# Patient Record
Sex: Female | Born: 1980 | Race: White | Hispanic: No | Marital: Married | Smoking: Former smoker
Health system: Southern US, Community
[De-identification: ages and names within clinical notes are randomized; demographics above are authoritative.]

## PROBLEM LIST (undated history)

## (undated) DIAGNOSIS — IMO0001 Reserved for inherently not codable concepts without codable children: Secondary | ICD-10-CM

## (undated) DIAGNOSIS — R200 Anesthesia of skin: Secondary | ICD-10-CM

## (undated) DIAGNOSIS — R51 Headache: Secondary | ICD-10-CM

## (undated) DIAGNOSIS — O24419 Gestational diabetes mellitus in pregnancy, unspecified control: Secondary | ICD-10-CM

## (undated) DIAGNOSIS — B977 Papillomavirus as the cause of diseases classified elsewhere: Secondary | ICD-10-CM

## (undated) DIAGNOSIS — E282 Polycystic ovarian syndrome: Secondary | ICD-10-CM

## (undated) DIAGNOSIS — R87619 Unspecified abnormal cytological findings in specimens from cervix uteri: Secondary | ICD-10-CM

## (undated) DIAGNOSIS — T8859XA Other complications of anesthesia, initial encounter: Secondary | ICD-10-CM

## (undated) DIAGNOSIS — K5909 Other constipation: Secondary | ICD-10-CM

## (undated) DIAGNOSIS — R202 Paresthesia of skin: Secondary | ICD-10-CM

## (undated) DIAGNOSIS — K219 Gastro-esophageal reflux disease without esophagitis: Secondary | ICD-10-CM

## (undated) DIAGNOSIS — K602 Anal fissure, unspecified: Secondary | ICD-10-CM

## (undated) DIAGNOSIS — IMO0002 Reserved for concepts with insufficient information to code with codable children: Secondary | ICD-10-CM

## (undated) HISTORY — PX: TOOTH EXTRACTION: SUR596

## (undated) HISTORY — DX: Paresthesia of skin: R20.2

## (undated) HISTORY — DX: Anesthesia of skin: R20.0

## (undated) HISTORY — PX: NECK SURGERY: SHX720

## (undated) HISTORY — PX: CERVICAL DISC SURGERY: SHX588

## (undated) HISTORY — DX: Anal fissure, unspecified: K60.2

---

## 2001-06-22 ENCOUNTER — Other Ambulatory Visit: Admission: RE | Admit: 2001-06-22 | Discharge: 2001-06-22 | Payer: Self-pay | Admitting: Obstetrics and Gynecology

## 2004-08-09 ENCOUNTER — Emergency Department (HOSPITAL_COMMUNITY): Admission: EM | Admit: 2004-08-09 | Discharge: 2004-08-09 | Payer: Self-pay | Admitting: Emergency Medicine

## 2011-01-25 LAB — ABO/RH: RH Type: POSITIVE

## 2011-01-25 LAB — RUBELLA ANTIBODY, IGM: Rubella: IMMUNE

## 2011-01-25 LAB — RPR: RPR: NONREACTIVE

## 2011-06-24 ENCOUNTER — Encounter: Payer: BC Managed Care – PPO | Attending: Obstetrics and Gynecology | Admitting: *Deleted

## 2011-06-24 ENCOUNTER — Encounter: Payer: Self-pay | Admitting: *Deleted

## 2011-06-24 DIAGNOSIS — Z713 Dietary counseling and surveillance: Secondary | ICD-10-CM | POA: Insufficient documentation

## 2011-06-24 DIAGNOSIS — E119 Type 2 diabetes mellitus without complications: Secondary | ICD-10-CM | POA: Insufficient documentation

## 2011-06-24 NOTE — Progress Notes (Signed)
  Patient was seen on 06/24/2011 for Gestational Diabetes self-management visit at the [facility]. The following learning objectives were met by the patient during this course:   States the definition of Gestational Diabetes  States why dietary management is important in controlling blood glucose  Describes the effects each nutrient has on blood glucose levels  Demonstrates ability to create a balanced meal plan  Demonstrates carbohydrate counting   States when to check blood glucose levels  Demonstrates proper blood glucose monitoring techniques  States the effect of stress and exercise on blood glucose levels  States the importance of limiting caffeine and abstaining from alcohol and smoking  Blood glucose monitor given: Architectural technologist Self Monitoring Kit Lot # [id number] Exp: 09/04/2012 Blood glucose reading: 74  Patient instructed to monitor glucose levels: FBS: 60 - <90 2 hour: <120  *Patient received handouts:  Nutrition Diabetes and Pregnancy  Carbohydrate Counting List  Patient will be seen for follow-up as needed.

## 2011-06-24 NOTE — Patient Instructions (Signed)
Goals:  Check glucose levels per MD as instructed  Follow Gestational Diabetes Diet as instructed  Call for follow-up as needed    

## 2011-06-25 ENCOUNTER — Ambulatory Visit: Payer: Self-pay | Admitting: *Deleted

## 2011-06-26 ENCOUNTER — Ambulatory Visit: Payer: Self-pay

## 2011-08-06 HISTORY — PX: CHOLECYSTECTOMY: SHX55

## 2011-08-14 ENCOUNTER — Encounter (HOSPITAL_COMMUNITY): Payer: Self-pay

## 2011-08-14 ENCOUNTER — Inpatient Hospital Stay (HOSPITAL_COMMUNITY)
Admission: AD | Admit: 2011-08-14 | Discharge: 2011-08-14 | Disposition: A | Discharge status: Home / Self Care | Payer: BC Managed Care – PPO | Source: Ambulatory Visit | Attending: Obstetrics and Gynecology | Admitting: Obstetrics and Gynecology

## 2011-08-14 HISTORY — DX: Polycystic ovarian syndrome: E28.2

## 2011-08-14 HISTORY — DX: Unspecified abnormal cytological findings in specimens from cervix uteri: R87.619

## 2011-08-14 HISTORY — DX: Gestational diabetes mellitus in pregnancy, unspecified control: O24.419

## 2011-08-14 HISTORY — DX: Papillomavirus as the cause of diseases classified elsewhere: B97.7

## 2011-08-14 HISTORY — DX: Reserved for concepts with insufficient information to code with codable children: IMO0002

## 2011-08-14 HISTORY — DX: Reserved for inherently not codable concepts without codable children: IMO0001

## 2011-08-14 HISTORY — DX: Headache: R51

## 2011-08-14 HISTORY — DX: Other constipation: K59.09

## 2011-08-14 NOTE — ED Notes (Signed)
?   SROM @ 0830 this AM; G1; 36 & 6/7 weeks gest;

## 2011-08-14 NOTE — ED Provider Notes (Signed)
History     Chief Complaint  Patient presents with  . Rupture of Membranes   HPIKelly M Downs is 31 y.o. G1P0 [redacted]w[redacted]d weeks presenting with ? Rupture of membranes. Pt of [facility].   Gives hx of loss of mucous plug yesterday.  Had thinner discharge today.  Denies vaginal bleeding.      Past Medical History  Diagnosis Date  . Abnormal Pap smear   . HPV in female   . Dysplasia   . Tuberculosis   . Gestational diabetes   . Headache   . Polycystic ovarian syndrome   . Constipation, chronic     Past Surgical History  Procedure Date  . Tooth extraction   . Neck surgery     No family history on file.  History  Substance Use Topics  . Smoking status: Former Games developer  . Smokeless tobacco: Not on file  . Alcohol Use: No    Allergies:  Allergies  Allergen Reactions  . Doxycycline Hives  . Penicillins Hives and Swelling  . Sulfa Antibiotics Hives    Prescriptions prior to admission  Medication Sig Dispense Refill  . Prenatal Vit-Fe Fumarate-FA (PRENATAL MULTIVITAMIN) TABS Take 1 tablet by mouth at bedtime.        ROS Physical Exam   Blood pressure 130/81, pulse 90, temperature 98.5 F (36.9 C), temperature source Oral, resp. rate 18, height 5\' 8"  (1.727 m), weight 255 lb 9.6 oz (115.939 kg), SpO2 99.00%.  Physical Exam   CERVICAL EXAM by D. Montez Morita, RN closed. Pooling of vaginal fluid not seen in the canal.  FERN was negative.  MAU Course  Procedures  MDM Findings reported to Dr. Arelia Sneddon by D. Montez Morita, RN  Assessment and Plan  A:  [redacted]weeks pregnant with intact membranes  P:  Keep scheduled appt.  Call your doctor for contractions, vaginal bleeding or loss of fluid or decreased fetal movement.  KEY,EVE M 08/14/2011, 2:50 PM   Matt Holmes, NP 08/14/11 1501

## 2011-08-14 NOTE — Progress Notes (Signed)
Patient states she started leaking clear fluid at 0830, has continued to have episodes of having wet panties. Has been having some lower abdominal tightening. Reports no bleeding and fetal movement.

## 2011-08-15 ENCOUNTER — Encounter (HOSPITAL_COMMUNITY): Payer: Self-pay

## 2011-08-18 ENCOUNTER — Encounter (HOSPITAL_COMMUNITY): Payer: Self-pay

## 2011-08-23 ENCOUNTER — Encounter (HOSPITAL_COMMUNITY): Payer: Self-pay

## 2011-08-27 ENCOUNTER — Encounter (HOSPITAL_COMMUNITY): Payer: Self-pay

## 2011-08-27 ENCOUNTER — Encounter (HOSPITAL_COMMUNITY)
Admission: RE | Admit: 2011-08-27 | Discharge: 2011-08-27 | Disposition: A | Discharge status: Home / Self Care | Payer: BC Managed Care – PPO | Source: Ambulatory Visit | Attending: Obstetrics and Gynecology | Admitting: Obstetrics and Gynecology

## 2011-08-27 HISTORY — DX: Gastro-esophageal reflux disease without esophagitis: K21.9

## 2011-08-27 LAB — CBC
MCHC: 34.4 g/dL (ref 30.0–36.0)
MCV: 92.3 fL (ref 78.0–100.0)
Platelets: 241 10*3/uL (ref 150–400)
RDW: 13.3 % (ref 11.5–15.5)
WBC: 8.2 10*3/uL (ref 4.0–10.5)

## 2011-08-27 LAB — BASIC METABOLIC PANEL
BUN: 10 mg/dL (ref 6–23)
CO2: 20 mEq/L (ref 19–32)
Calcium: 9.4 mg/dL (ref 8.4–10.5)
Chloride: 104 mEq/L (ref 96–112)
Creatinine, Ser: 0.69 mg/dL (ref 0.50–1.10)

## 2011-08-27 LAB — SURGICAL PCR SCREEN
MRSA, PCR: NEGATIVE
Staphylococcus aureus: NEGATIVE

## 2011-08-27 NOTE — Patient Instructions (Addendum)
   Your procedure is scheduled on: Monday January 28th  Enter through the Main Entrance of [facility] at: 11:30am Pick up the phone at the desk and dial [phone] and inform us of your arrival.  Please call this number if you have any problems the morning of surgery: [phone]  Remember: Do not eat food after midnight:Sunday Do not drink clear liquids after: 9am Monday Take these medicines the morning of surgery with a SIP OF WATER:zantac  Do not wear jewelry, make-up, or FINGER nail polish Do not wear lotions, powders, perfumes or deodorant. Do not shave 48 hours prior to surgery. Do not bring valuables to the hospital.  Leave suitcase in the car. After Surgery it may be brought to your room. For patients being admitted to the hospital, checkout time is 11:00am the day of discharge.     Remember to use your hibiclens as instructed.Please shower with 1/2 bottle the evening before your surgery and the other 1/2 bottle the morning of surgery.

## 2011-09-02 ENCOUNTER — Encounter (HOSPITAL_COMMUNITY): Payer: Self-pay

## 2011-09-02 ENCOUNTER — Encounter (HOSPITAL_COMMUNITY): Payer: Self-pay | Admitting: Anesthesiology

## 2011-09-02 ENCOUNTER — Encounter (HOSPITAL_COMMUNITY): Payer: Self-pay | Admitting: *Deleted

## 2011-09-02 ENCOUNTER — Encounter (HOSPITAL_COMMUNITY)
Admission: RE | Disposition: A | Discharge status: Home / Self Care | Payer: Self-pay | Source: Ambulatory Visit | Attending: Obstetrics and Gynecology

## 2011-09-02 ENCOUNTER — Inpatient Hospital Stay (HOSPITAL_COMMUNITY): Payer: BC Managed Care – PPO

## 2011-09-02 ENCOUNTER — Inpatient Hospital Stay (HOSPITAL_COMMUNITY)
Admission: RE | Admit: 2011-09-02 | Discharge: 2011-09-04 | DRG: 651 | Disposition: A | Discharge status: Home / Self Care | Payer: BC Managed Care – PPO | Source: Ambulatory Visit | Attending: Obstetrics and Gynecology | Admitting: Obstetrics and Gynecology

## 2011-09-02 DIAGNOSIS — O99814 Abnormal glucose complicating childbirth: Principal | ICD-10-CM | POA: Diagnosis present

## 2011-09-02 LAB — GLUCOSE, CAPILLARY: Glucose-Capillary: 74 mg/dL (ref 70–99)

## 2011-09-02 SURGERY — Surgical Case
Anesthesia: Spinal | Wound class: Clean Contaminated

## 2011-09-02 MED ORDER — OXYTOCIN 20 UNITS IN LACTATED RINGERS INFUSION - SIMPLE
125.0000 mL/h | INTRAVENOUS | Status: DC
  Administered 2011-09-02: 125 mL/h via INTRAVENOUS

## 2011-09-02 MED ORDER — MEASLES, MUMPS & RUBELLA VAC ~~LOC~~ INJ
0.5000 mL | INJECTION | Freq: Once | SUBCUTANEOUS | Status: DC
  Filled 2011-09-02: qty 0.5

## 2011-09-02 MED ORDER — NALBUPHINE HCL 10 MG/ML IJ SOLN: 5.0000 mg | INTRAMUSCULAR | Status: DC | PRN

## 2011-09-02 MED ORDER — MENTHOL 3 MG MT LOZG: 1.0000 | LOZENGE | OROMUCOSAL | Status: DC | PRN

## 2011-09-02 MED ORDER — OXYCODONE-ACETAMINOPHEN 5-325 MG PO TABS
1.0000 | ORAL_TABLET | ORAL | Status: DC | PRN
  Administered 2011-09-04 (×2): 1 via ORAL
  Filled 2011-09-02 (×2): qty 1

## 2011-09-02 MED ORDER — SIMETHICONE 80 MG PO CHEW
80.0000 mg | CHEWABLE_TABLET | Freq: Three times a day (TID) | ORAL | Status: DC
  Administered 2011-09-02 – 2011-09-04 (×7): 80 mg via ORAL

## 2011-09-02 MED ORDER — MEDROXYPROGESTERONE ACETATE 150 MG/ML IM SUSP: 150.0000 mg | INTRAMUSCULAR | Status: DC | PRN

## 2011-09-02 MED ORDER — PRENATAL MULTIVITAMIN CH
1.0000 | ORAL_TABLET | Freq: Every day | ORAL | Status: DC
  Administered 2011-09-03 – 2011-09-04 (×2): 1 via ORAL
  Filled 2011-09-02 (×2): qty 1

## 2011-09-02 MED ORDER — SODIUM CHLORIDE 0.9 % IV SOLN: 1.0000 ug/kg/h | INTRAVENOUS | Status: DC | PRN

## 2011-09-02 MED ORDER — OXYTOCIN 10 UNIT/ML IJ SOLN
INTRAMUSCULAR | Status: DC | PRN
  Administered 2011-09-02: 20 [IU] via INTRAMUSCULAR

## 2011-09-02 MED ORDER — FENTANYL CITRATE 0.05 MG/ML IJ SOLN
INTRAMUSCULAR | Status: AC
  Filled 2011-09-02: qty 2

## 2011-09-02 MED ORDER — NALBUPHINE HCL 10 MG/ML IJ SOLN
5.0000 mg | INTRAMUSCULAR | Status: DC | PRN
  Administered 2011-09-02: 10 mg via SUBCUTANEOUS

## 2011-09-02 MED ORDER — IBUPROFEN 600 MG PO TABS
600.0000 mg | ORAL_TABLET | Freq: Four times a day (QID) | ORAL | Status: DC | PRN
  Filled 2011-09-02 (×6): qty 1

## 2011-09-02 MED ORDER — ONDANSETRON HCL 4 MG/2ML IJ SOLN
INTRAMUSCULAR | Status: DC | PRN
  Administered 2011-09-02: 4 mg via INTRAVENOUS

## 2011-09-02 MED ORDER — DIPHENHYDRAMINE HCL 25 MG PO CAPS: 25.0000 mg | ORAL_CAPSULE | ORAL | Status: DC | PRN

## 2011-09-02 MED ORDER — DIPHENHYDRAMINE HCL 50 MG/ML IJ SOLN: 25.0000 mg | INTRAMUSCULAR | Status: DC | PRN

## 2011-09-02 MED ORDER — CLINDAMYCIN PHOSPHATE 900 MG/50ML IV SOLN
900.0000 mg | Freq: Once | INTRAVENOUS | Status: AC
  Administered 2011-09-02: 900 mg via INTRAVENOUS
  Filled 2011-09-02: qty 50

## 2011-09-02 MED ORDER — MORPHINE SULFATE 0.5 MG/ML IJ SOLN
INTRAMUSCULAR | Status: AC
  Filled 2011-09-02: qty 10

## 2011-09-02 MED ORDER — DEXTROSE IN LACTATED RINGERS 5 % IV SOLN
INTRAVENOUS | Status: DC
  Administered 2011-09-03: via INTRAVENOUS

## 2011-09-02 MED ORDER — EPHEDRINE 5 MG/ML INJ
INTRAVENOUS | Status: AC
  Filled 2011-09-02: qty 10

## 2011-09-02 MED ORDER — DIPHENHYDRAMINE HCL 50 MG/ML IJ SOLN: 12.5000 mg | INTRAMUSCULAR | Status: DC | PRN

## 2011-09-02 MED ORDER — BUPIVACAINE IN DEXTROSE 0.75-8.25 % IT SOLN
INTRATHECAL | Status: DC | PRN
  Administered 2011-09-02: 1.5 mL via INTRATHECAL

## 2011-09-02 MED ORDER — SCOPOLAMINE 1 MG/3DAYS TD PT72
MEDICATED_PATCH | TRANSDERMAL | Status: AC
  Administered 2011-09-02: 1.5 mg via TRANSDERMAL
  Filled 2011-09-02: qty 1

## 2011-09-02 MED ORDER — KETOROLAC TROMETHAMINE 60 MG/2ML IM SOLN
INTRAMUSCULAR | Status: AC
  Administered 2011-09-02: 60 mg via INTRAMUSCULAR
  Filled 2011-09-02: qty 2

## 2011-09-02 MED ORDER — DIPHENHYDRAMINE HCL 25 MG PO CAPS: 25.0000 mg | ORAL_CAPSULE | Freq: Four times a day (QID) | ORAL | Status: DC | PRN

## 2011-09-02 MED ORDER — NALBUPHINE SYRINGE 5 MG/0.5 ML
INJECTION | INTRAMUSCULAR | Status: AC
  Administered 2011-09-02: 10 mg via SUBCUTANEOUS
  Filled 2011-09-02: qty 0.5

## 2011-09-02 MED ORDER — OXYTOCIN 20 UNITS IN LACTATED RINGERS INFUSION - SIMPLE: 125.0000 mL/h | INTRAVENOUS | Status: AC

## 2011-09-02 MED ORDER — WITCH HAZEL-GLYCERIN EX PADS: 1.0000 "application " | MEDICATED_PAD | CUTANEOUS | Status: DC | PRN

## 2011-09-02 MED ORDER — LANOLIN HYDROUS EX OINT: 1.0000 "application " | TOPICAL_OINTMENT | CUTANEOUS | Status: DC | PRN

## 2011-09-02 MED ORDER — SCOPOLAMINE 1 MG/3DAYS TD PT72: 1.0000 | MEDICATED_PATCH | Freq: Once | TRANSDERMAL | Status: DC

## 2011-09-02 MED ORDER — PHENYLEPHRINE 40 MCG/ML (10ML) SYRINGE FOR IV PUSH (FOR BLOOD PRESSURE SUPPORT)
PREFILLED_SYRINGE | INTRAVENOUS | Status: AC
  Filled 2011-09-02: qty 5

## 2011-09-02 MED ORDER — ONDANSETRON HCL 4 MG/2ML IJ SOLN: 4.0000 mg | Freq: Three times a day (TID) | INTRAMUSCULAR | Status: DC | PRN

## 2011-09-02 MED ORDER — ONDANSETRON HCL 4 MG PO TABS: 4.0000 mg | ORAL_TABLET | ORAL | Status: DC | PRN

## 2011-09-02 MED ORDER — EPHEDRINE SULFATE 50 MG/ML IJ SOLN
INTRAMUSCULAR | Status: DC | PRN
  Administered 2011-09-02: 10 mg via INTRAVENOUS

## 2011-09-02 MED ORDER — ONDANSETRON HCL 4 MG/2ML IJ SOLN
INTRAMUSCULAR | Status: AC
  Filled 2011-09-02: qty 2

## 2011-09-02 MED ORDER — SENNOSIDES-DOCUSATE SODIUM 8.6-50 MG PO TABS
2.0000 | ORAL_TABLET | Freq: Every day | ORAL | Status: DC
  Administered 2011-09-02 – 2011-09-03 (×2): 2 via ORAL

## 2011-09-02 MED ORDER — NALBUPHINE SYRINGE 5 MG/0.5 ML
INJECTION | INTRAMUSCULAR | Status: AC
  Filled 2011-09-02: qty 0.5

## 2011-09-02 MED ORDER — ONDANSETRON HCL 4 MG/2ML IJ SOLN: 4.0000 mg | INTRAMUSCULAR | Status: DC | PRN

## 2011-09-02 MED ORDER — METOCLOPRAMIDE HCL 5 MG/ML IJ SOLN: 10.0000 mg | Freq: Three times a day (TID) | INTRAMUSCULAR | Status: DC | PRN

## 2011-09-02 MED ORDER — PHENYLEPHRINE HCL 10 MG/ML IJ SOLN
INTRAMUSCULAR | Status: DC | PRN
  Administered 2011-09-02: .4 mg via INTRAVENOUS

## 2011-09-02 MED ORDER — OXYTOCIN 10 UNIT/ML IJ SOLN
INTRAMUSCULAR | Status: AC
  Filled 2011-09-02: qty 2

## 2011-09-02 MED ORDER — FENTANYL CITRATE 0.05 MG/ML IJ SOLN: 25.0000 ug | INTRAMUSCULAR | Status: DC | PRN

## 2011-09-02 MED ORDER — LACTATED RINGERS IV SOLN
INTRAVENOUS | Status: DC
  Administered 2011-09-02 (×2): via INTRAVENOUS

## 2011-09-02 MED ORDER — OXYTOCIN 20 UNITS IN LACTATED RINGERS INFUSION - SIMPLE
INTRAVENOUS | Status: AC
  Filled 2011-09-02: qty 1000

## 2011-09-02 MED ORDER — DIBUCAINE 1 % RE OINT: 1.0000 "application " | TOPICAL_OINTMENT | RECTAL | Status: DC | PRN

## 2011-09-02 MED ORDER — SIMETHICONE 80 MG PO CHEW
80.0000 mg | CHEWABLE_TABLET | ORAL | Status: DC | PRN
Start: 2011-09-02 — End: 2011-09-04

## 2011-09-02 MED ORDER — FENTANYL CITRATE 0.05 MG/ML IJ SOLN
INTRAMUSCULAR | Status: DC | PRN
  Administered 2011-09-02: 25 ug via INTRAVENOUS
  Administered 2011-09-02: 15 ug via INTRATHECAL
  Administered 2011-09-02: 25 ug via INTRAVENOUS

## 2011-09-02 MED ORDER — DEXAMETHASONE SODIUM PHOSPHATE 10 MG/ML IJ SOLN: INTRAMUSCULAR | Status: DC | PRN

## 2011-09-02 MED ORDER — TETANUS-DIPHTH-ACELL PERTUSSIS 5-2.5-18.5 LF-MCG/0.5 IM SUSP: 0.5000 mL | Freq: Once | INTRAMUSCULAR | Status: DC

## 2011-09-02 MED ORDER — 0.9 % SODIUM CHLORIDE (POUR BTL) OPTIME
TOPICAL | Status: DC | PRN
  Administered 2011-09-02: 1000 mL

## 2011-09-02 MED ORDER — MEPERIDINE HCL 25 MG/ML IJ SOLN: 6.2500 mg | INTRAMUSCULAR | Status: DC | PRN

## 2011-09-02 MED ORDER — SCOPOLAMINE 1 MG/3DAYS TD PT72
1.0000 | MEDICATED_PATCH | Freq: Once | TRANSDERMAL | Status: DC
  Administered 2011-09-02: 1.5 mg via TRANSDERMAL

## 2011-09-02 MED ORDER — KETOROLAC TROMETHAMINE 60 MG/2ML IM SOLN
60.0000 mg | Freq: Once | INTRAMUSCULAR | Status: AC | PRN
  Administered 2011-09-02: 60 mg via INTRAMUSCULAR

## 2011-09-02 MED ORDER — KETOROLAC TROMETHAMINE 30 MG/ML IJ SOLN: 30.0000 mg | Freq: Four times a day (QID) | INTRAMUSCULAR | Status: AC | PRN

## 2011-09-02 MED ORDER — SODIUM CHLORIDE 0.9 % IJ SOLN: 3.0000 mL | INTRAMUSCULAR | Status: DC | PRN

## 2011-09-02 MED ORDER — MORPHINE SULFATE (PF) 0.5 MG/ML IJ SOLN
INTRAMUSCULAR | Status: DC | PRN
  Administered 2011-09-02: 1 mg via INTRAVENOUS
  Administered 2011-09-02: .1 mg via INTRATHECAL
  Administered 2011-09-02: 1 mg via INTRAVENOUS

## 2011-09-02 MED ORDER — NALOXONE HCL 0.4 MG/ML IJ SOLN: 0.4000 mg | INTRAMUSCULAR | Status: DC | PRN

## 2011-09-02 MED ORDER — IBUPROFEN 600 MG PO TABS
600.0000 mg | ORAL_TABLET | Freq: Four times a day (QID) | ORAL | Status: DC
  Administered 2011-09-02 – 2011-09-04 (×7): 600 mg via ORAL
  Filled 2011-09-02: qty 1

## 2011-09-02 MED ORDER — PHENYLEPHRINE HCL 10 MG/ML IJ SOLN
INTRAMUSCULAR | Status: DC | PRN
  Administered 2011-09-02: 80 ug via INTRAVENOUS
  Administered 2011-09-02: 40 ug via INTRAVENOUS

## 2011-09-02 SURGICAL SUPPLY — 27 items
CHLORAPREP W/TINT 26ML (MISCELLANEOUS) ×2 IMPLANT
CLOTH BEACON ORANGE TIMEOUT ST (SAFETY) ×2 IMPLANT
DRSG COVADERM 4X8 (GAUZE/BANDAGES/DRESSINGS) ×2 IMPLANT
ELECT REM PT RETURN 9FT ADLT (ELECTROSURGICAL) ×2
ELECTRODE REM PT RTRN 9FT ADLT (ELECTROSURGICAL) ×1 IMPLANT
EXTRACTOR VACUUM M CUP 4 TUBE (SUCTIONS) IMPLANT
GLOVE BIO SURGEON STRL SZ 6.5 (GLOVE) ×4 IMPLANT
GLOVE BIOGEL PI IND STRL 7.0 (GLOVE) ×2 IMPLANT
GLOVE BIOGEL PI INDICATOR 7.0 (GLOVE) ×2
GLOVE ECLIPSE 6.5 STRL STRAW (GLOVE) ×2 IMPLANT
GOWN PREVENTION PLUS LG XLONG (DISPOSABLE) ×6 IMPLANT
GOWN SURGICAL LARGE (GOWNS) ×2 IMPLANT
KIT ABG SYR 3ML LUER SLIP (SYRINGE) ×2 IMPLANT
NEEDLE HYPO 25X5/8 SAFETYGLIDE (NEEDLE) ×2 IMPLANT
NS IRRIG 1000ML POUR BTL (IV SOLUTION) ×2 IMPLANT
PACK C SECTION WH (CUSTOM PROCEDURE TRAY) ×2 IMPLANT
SLEEVE SCD COMPRESS KNEE MED (MISCELLANEOUS) IMPLANT
STAPLER VISISTAT 35W (STAPLE) IMPLANT
SUT CHROMIC 0 CT 802H (SUTURE) IMPLANT
SUT CHROMIC 0 CTX 36 (SUTURE) ×6 IMPLANT
SUT MNCRL AB 3-0 PS2 27 (SUTURE) IMPLANT
SUT MON AB-0 CT1 36 (SUTURE) ×2 IMPLANT
SUT PDS AB 0 CTX 60 (SUTURE) ×2 IMPLANT
SUT PLAIN 0 NONE (SUTURE) IMPLANT
TOWEL OR 17X24 6PK STRL BLUE (TOWEL DISPOSABLE) ×4 IMPLANT
TRAY FOLEY CATH 14FR (SET/KITS/TRAYS/PACK) IMPLANT
WATER STERILE IRR 1000ML POUR (IV SOLUTION) ×2 IMPLANT

## 2011-09-02 NOTE — Anesthesia Preprocedure Evaluation (Addendum)
Anesthesia Evaluation  Patient identified by MRN, date of birth, ID band Patient awake    Reviewed: Allergy & Precautions, H&P , NPO status , Patient's Chart, lab work & pertinent test results, reviewed documented beta blocker date and time   History of Anesthesia Complications Negative for: history of anesthetic complications  Airway Mallampati: I TM Distance: >3 FB Neck ROM: full    Dental  (+) Teeth Intact   Pulmonary neg pulmonary ROS,  clear to auscultation        Cardiovascular neg cardio ROS regular Normal    Neuro/Psych Negative Neurological ROS  Negative Psych ROS   GI/Hepatic Neg liver ROS, GERD- (OTC meds prn.  zantac today)  Medicated,  Endo/Other  Diabetes mellitus-, Well Controlled, GestationalObesity PCOS  Renal/GU negative Renal ROS     Musculoskeletal   Abdominal   Peds  Hematology negative hematology ROS (+)   Anesthesia Other Findings   Reproductive/Obstetrics (+) Pregnancy                           Anesthesia Physical Anesthesia Plan  ASA: II  Anesthesia Plan: Spinal   Post-op Pain Management:    Induction:   Airway Management Planned:   Additional Equipment:   Intra-op Plan:   Post-operative Plan:   Informed Consent: I have reviewed the patients History and Physical, chart, labs and discussed the procedure including the risks, benefits and alternatives for the proposed anesthesia with the patient or authorized representative who has indicated his/her understanding and acceptance.   Dental Advisory Given  Plan Discussed with: CRNA and Surgeon  Anesthesia Plan Comments:         Anesthesia Quick Evaluation

## 2011-09-02 NOTE — Anesthesia Postprocedure Evaluation (Signed)
Anesthesia Post Note  Patient: Dana Downs  Procedure(s) Performed:  CESAREAN SECTION - Primary cesarean section  Anesthesia type: Spinal  Patient location: PACU  Post pain: Pain level controlled  Post assessment: Post-op Vital signs reviewed  Last Vitals:  Filed Vitals:   09/02/11 1445  BP: 120/62  Pulse: 72  Temp: 36.4 C  Resp: 20    Post vital signs: Reviewed  Level of consciousness: awake  Complications: No apparent anesthesia complications

## 2011-09-02 NOTE — Anesthesia Procedure Notes (Signed)
Spinal  Patient location during procedure: OR Start time: 09/02/2011 1:04 PM Staffing Performed by: anesthesiologist  Preanesthetic Checklist Completed: patient identified, site marked, surgical consent, pre-op evaluation, timeout performed, IV checked, risks and benefits discussed and monitors and equipment checked Spinal Block Patient position: sitting Prep: site prepped and draped and DuraPrep Patient monitoring: blood pressure, continuous pulse ox and heart rate Approach: midline Location: L3-4 Injection technique: single-shot Needle Needle type: Sprotte  Needle gauge: 24 G Needle length: 9 cm Assessment Sensory level: T4 Additional Notes Clear free flow CSF on first attempt.  Patient tolerated procedure well.  Jasmine December, MD

## 2011-09-02 NOTE — H&P (Signed)
31 yo G1P0 @ 39+4 wks presents for elective c-section.  No ctx, vb or lof.  Pregnancy complicated by well controlled A1DM.  Past History - See hollister, GBS neg  AF, VSS +FHT Gen - NAD ABd - gravid, NT CV - RRR Lungs - clear bilaterally PV- cvx 4cm  A/P:  Pt declines TOL.  R/B/A d/w pt and informed consent obtained.

## 2011-09-02 NOTE — Transfer of Care (Signed)
Immediate Anesthesia Transfer of Care Note  Patient: Dana Downs  Procedure(s) Performed:  CESAREAN SECTION - Primary cesarean section  Patient Location: PACU  Anesthesia Type: Spinal  Level of Consciousness: awake, alert  and oriented  Airway & Oxygen Therapy: Patient Spontanous Breathing  Post-op Assessment: Report given to PACU RN and Post -op Vital signs reviewed and stable  Post vital signs: Reviewed and stable  Complications: No apparent anesthesia complications2

## 2011-09-03 LAB — CBC
HCT: 32.9 % — ABNORMAL LOW (ref 36.0–46.0)
Platelets: 200 10*3/uL (ref 150–400)
RBC: 3.53 MIL/uL — ABNORMAL LOW (ref 3.87–5.11)
RDW: 13.4 % (ref 11.5–15.5)
WBC: 8.3 10*3/uL (ref 4.0–10.5)

## 2011-09-03 NOTE — Addendum Note (Signed)
Addendum  created 09/03/11 0756 by Truitt Leep, CRNA   Modules edited:Notes Section

## 2011-09-03 NOTE — Anesthesia Postprocedure Evaluation (Signed)
  Anesthesia Post-op Note  Patient: Dana Downs  Procedure(s) Performed:  CESAREAN SECTION - Primary cesarean section  Patient Location: PACU and Mother/Baby  Anesthesia Type: Spinal  Level of Consciousness: awake, alert  and oriented  Airway and Oxygen Therapy: Patient Spontanous Breathing  Post-op Pain: mild  Post-op Assessment: Patient's Cardiovascular Status Stable, Respiratory Function Stable and No signs of Nausea or vomiting, pain level controlled.  Post-op Vital Signs: stable  Complications: No apparent anesthesia complications

## 2011-09-03 NOTE — Progress Notes (Signed)
Subjective: Postpartum Day 1: Cesarean Delivery Patient reports tolerating PO and no problems voiding.    Objective: Vital signs in last 24 hours: Temp:  [97.5 F (36.4 C)-99.2 F (37.3 C)] 98.9 F (37.2 C) (01/29 0515) Pulse Rate:  [54-110] 54  (01/29 0515) Resp:  [16-20] 20  (01/29 0515) BP: (95-126)/(52-80) 100/60 mmHg (01/29 0515) SpO2:  [96 %-100 %] 96 % (01/29 0515) Weight:  [116.121 kg (256 lb)] 116.121 kg (256 lb) (01/28 1700)  Physical Exam:  General: alert and cooperative Lochia: appropriate Uterine Fundus: firm Abd dressing CDI BS+ DVT Evaluation: No evidence of DVT seen on physical exam.   Basename 09/03/11 0545  HGB 11.1*  HCT 32.9*    Assessment/Plan: Status post Cesarean section. Doing well postoperatively.  Continue current care.  Rhandi Despain G 09/03/2011, 7:57 AM

## 2011-09-03 NOTE — Op Note (Signed)
Cesarean Section Procedure Note   Dana Downs  09/02/2011  Indications: maternal request   Pre-operative Diagnosis: Gestational diabetes, maternal request.   Post-operative Diagnosis: Same   Surgeon: Surgeon(s) and Role:    * Zelphia Cairo, MD - Primary   Assistants: none  Anesthesia: spinal   Procedure Details:  The patient was seen in the Holding Room. The risks, benefits, complications, treatment options, and expected outcomes were discussed with the patient. The patient concurred with the proposed plan, giving informed consent. identified as FALISA LAMORA and the procedure verified as C-Section Delivery. A Time Out was held and the above information confirmed.  After induction of anesthesia, the patient was draped and prepped in the usual sterile manner. A transverse was made and carried down through the subcutaneous tissue to the fascia. Fascial incision was made and extended transversely. The fascia was separated from the underlying rectus tissue superiorly and inferiorly. The peritoneum was identified and entered. Peritoneal incision was extended longitudinally. The utero-vesical peritoneal reflection was incised transversely and the bladder flap was bluntly freed from the lower uterine segment. A low transverse uterine incision was made. Delivered from cephalic presentation was a viable infant. Cord ph was sent the umbilical cord was clamped and cut cord blood was obtained for evaluation. The placenta was removed Intact and appeared normal. The uterine outline, tubes and ovaries appeared normal}. The uterine incision was closed with running locked sutures of 0chromic gut.   Hemostasis was observed. Lavage was carried out until clear. Peritoneum was reaproximated with 0 monocryl.  The fascia was then reapproximated with running sutures of 0PDS. The skin was closed with staples.   Instrument, sponge, and needle counts were correct prior the abdominal closure and were correct at the  conclusion of the case.     Estimated Blood Loss: 600cc  Urine Output: clear  Specimens: none  Complications: no complications  Disposition: PACU - hemodynamically stable.   Maternal Condition: stable   Baby condition / location:  nursery-stable  Attending Attestation: I was present and scrubbed for the entire procedure.   Signed: Surgeon(s): Zelphia Cairo, MD

## 2011-09-04 ENCOUNTER — Encounter (HOSPITAL_COMMUNITY): Payer: Self-pay | Admitting: Obstetrics and Gynecology

## 2011-09-04 MED ORDER — IBUPROFEN 600 MG PO TABS
600.0000 mg | ORAL_TABLET | Freq: Four times a day (QID) | ORAL | Status: AC | PRN
Start: 2011-09-04 — End: 2011-09-14

## 2011-09-04 MED ORDER — OXYCODONE-ACETAMINOPHEN 5-325 MG PO TABS: 1.0000 | ORAL_TABLET | ORAL | Status: AC | PRN

## 2011-09-04 NOTE — Progress Notes (Signed)
Subjective: Postpartum Day 2: Cesarean Delivery Patient reports tolerating PO, + flatus and no problems voiding.    Objective: Vital signs in last 24 hours: Temp:  [97.8 F (36.6 C)-98.2 F (36.8 C)] 97.8 F (36.6 C) (01/30 0541) Pulse Rate:  [62-83] 66  (01/30 0541) Resp:  [18-20] 18  (01/30 0541) BP: (108-113)/(54-74) 113/67 mmHg (01/30 0541) SpO2:  [95 %-98 %] 95 % (01/30 0541)  Physical Exam:  General: alert and cooperative Lochia: appropriate Uterine Fundus: firm Incision: healing well DVT Evaluation: No evidence of DVT seen on physical exam.   Basename 09/03/11 0545  HGB 11.1*  HCT 32.9*    Assessment/Plan: Status post Cesarean section. Doing well postoperatively.  Discharge home with standard precautions and return to clinic in 1-2 days for staple removal.  Anani Gu G 09/04/2011, 7:56 AM

## 2011-09-04 NOTE — Discharge Summary (Signed)
Obstetric Discharge Summary Reason for Admission: cesarean section Prenatal Procedures: ultrasound Intrapartum Procedures: cesarean: low cervical, transverse Postpartum Procedures: none Complications-Operative and Postpartum: none Hemoglobin  Date Value Range Status  09/03/2011 11.1* 12.0-15.0 (g/dL) Final     HCT  Date Value Range Status  09/03/2011 32.9* 36.0-46.0 (%) Final    Discharge Diagnoses: Term Pregnancy-delivered  Discharge Information: Date: 09/04/2011 Activity: pelvic rest Diet: routine Medications: PNV, Ibuprofen and Percocet Condition: stable Instructions: refer to practice specific booklet Discharge to: home   Newborn Data: Live born female  Birth Weight: 7 lb 7.6 oz (3390 g) APGAR: 9, 9  Home with mother.  Dana Downs G 09/04/2011, 8:09 AM

## 2011-09-05 ENCOUNTER — Inpatient Hospital Stay (HOSPITAL_COMMUNITY): Admission: AD | Admit: 2011-09-05 | Payer: Self-pay | Source: Ambulatory Visit | Admitting: Obstetrics and Gynecology

## 2012-11-20 ENCOUNTER — Encounter: Payer: Self-pay | Admitting: *Deleted

## 2012-11-24 ENCOUNTER — Ambulatory Visit (INDEPENDENT_AMBULATORY_CARE_PROVIDER_SITE_OTHER): Payer: BC Managed Care – PPO | Admitting: Family Medicine

## 2012-11-24 ENCOUNTER — Encounter: Payer: Self-pay | Admitting: Family Medicine

## 2012-11-24 VITALS — BP 152/90 | HR 70 | Wt 248.0 lb

## 2012-11-24 DIAGNOSIS — I10 Essential (primary) hypertension: Secondary | ICD-10-CM | POA: Insufficient documentation

## 2012-11-24 MED ORDER — HYDROCHLOROTHIAZIDE 25 MG PO TABS: 25.0000 mg | ORAL_TABLET | Freq: Every day | ORAL | Status: DC

## 2012-11-24 MED ORDER — POTASSIUM CHLORIDE ER 10 MEQ PO TBCR: 10.0000 meq | EXTENDED_RELEASE_TABLET | Freq: Two times a day (BID) | ORAL | Status: DC

## 2012-11-24 NOTE — Progress Notes (Signed)
  Subjective:    Patient ID: Dana Downs, female    DOB: 1980-09-25, 32 y.o.   MRN: [id number]  Hypertension This is a new problem. The current episode started more than 1 month ago. The problem is unchanged. The problem is uncontrolled. Pertinent negatives include no chest pain, headaches, malaise/fatigue, neck pain, orthopnea or palpitations. There are no associated agents to hypertension. There are no known risk factors for coronary artery disease. Past treatments include lifestyle changes. The current treatment provides no improvement. There are no compliance problems.  There is no history of angina, kidney disease or CVA. There is no history of chronic renal disease or sleep apnea.      Review of Systems  Constitutional: Negative for fever, malaise/fatigue, activity change and fatigue.  HENT: Negative for congestion, rhinorrhea and neck pain.   Respiratory: Negative for choking and chest tightness.   Cardiovascular: Negative for chest pain, palpitations and orthopnea.  Neurological: Negative for headaches.       Objective:   Physical Exam  Vitals reviewed. Constitutional: She appears well-developed and well-nourished.  HENT:  Head: Normocephalic.  Neck: Normal range of motion. Neck supple. No thyromegaly present.  Cardiovascular: Normal rate, regular rhythm and normal heart sounds.   Pulmonary/Chest: Effort normal and breath sounds normal.  Abdominal: Soft.  Lymphadenopathy:    She has no cervical adenopathy.  Skin: She is not diaphoretic.          Assessment & Plan:  Hypertension-lifestyle changes alone did not do enough. She needs to do a better job in minimizing starches in her diet stay away from sweet tea. Exercise on a regular basis. Try to reduce weight. Also start HCTZ 25 mg one half in the morning do this over the course of the next few weeks. If needing blood pressure goals stick with this if not increase to a full 25 mg every morning. Potassium 10 mEq was also  prescribed one or 2 per day depending on the dose of the HCTZ. She will check her blood pressure through her nurse practitioner at work and then send the results to [place] we will see her back in approximately 4 months.

## 2012-11-24 NOTE — Patient Instructions (Addendum)
Fax # [phone]  Send labs to [place]  Start with a half tablet daily   Goal bp 130/80 or less  Follow up 4 months

## 2013-01-04 ENCOUNTER — Telehealth: Payer: Self-pay | Admitting: Family Medicine

## 2013-01-04 NOTE — Telephone Encounter (Signed)
Advised patient to hold meds and follow up in office this week per Dr Lorin Picket. Patient verbalized understanding.

## 2013-01-04 NOTE — Telephone Encounter (Signed)
Patient is getting headaches and nausea from the blood pressure medication that she was prescribed back in April. She just began it today because the gastro doctor advised her that the cream that she was using would lower her BP. She would like to know if this is normal?

## 2013-01-06 ENCOUNTER — Ambulatory Visit: Payer: BC Managed Care – PPO | Admitting: Family Medicine

## 2013-01-08 ENCOUNTER — Ambulatory Visit (INDEPENDENT_AMBULATORY_CARE_PROVIDER_SITE_OTHER): Payer: BC Managed Care – PPO | Admitting: Family Medicine

## 2013-01-08 ENCOUNTER — Encounter: Payer: Self-pay | Admitting: Family Medicine

## 2013-01-08 VITALS — BP 137/82 | HR 80 | Wt 260.0 lb

## 2013-01-08 DIAGNOSIS — R5381 Other malaise: Secondary | ICD-10-CM

## 2013-01-08 DIAGNOSIS — I10 Essential (primary) hypertension: Secondary | ICD-10-CM

## 2013-01-08 DIAGNOSIS — R635 Abnormal weight gain: Secondary | ICD-10-CM

## 2013-01-08 NOTE — Patient Instructions (Addendum)
DASH Diet The DASH diet stands for "Dietary Approaches to Stop Hypertension." It is a healthy eating plan that has been shown to reduce high blood pressure (hypertension) in as little as 14 days, while also possibly providing other significant health benefits. These other health benefits include reducing the risk of breast cancer after menopause and reducing the risk of type 2 diabetes, heart disease, colon cancer, and stroke. Health benefits also include weight loss and slowing kidney failure in patients with chronic kidney disease.  DIET GUIDELINES  Limit salt (sodium). Your diet should contain less than 1500 mg of sodium daily.  Limit refined or processed carbohydrates. Your diet should include mostly whole grains. Desserts and added sugars should be used sparingly.  Include small amounts of heart-healthy fats. These types of fats include nuts, oils, and tub margarine. Limit saturated and trans fats. These fats have been shown to be harmful in the body. CHOOSING FOODS  The following food groups are based on a 2000 calorie diet. See your Registered Dietitian for individual calorie needs. Grains and Grain Products (6 to 8 servings daily)  Eat More Often: Whole-wheat bread, brown rice, whole-grain or wheat pasta, quinoa, popcorn without added fat or salt (air popped).  Eat Less Often: White bread, white pasta, white rice, cornbread. Vegetables (4 to 5 servings daily)  Eat More Often: Fresh, frozen, and canned vegetables. Vegetables may be raw, steamed, roasted, or grilled with a minimal amount of fat.  Eat Less Often/Avoid: Creamed or fried vegetables. Vegetables in a cheese sauce. Fruit (4 to 5 servings daily)  Eat More Often: All fresh, canned (in natural juice), or frozen fruits. Dried fruits without added sugar. One hundred percent fruit juice ( cup [237 mL] daily).  Eat Less Often: Dried fruits with added sugar. Canned fruit in light or heavy syrup. [employer], Fish, and Poultry (2  servings or less daily. One serving is 3 to 4 oz [85-114 g]).  Eat More Often: Ninety percent or leaner ground beef, tenderloin, sirloin. Round cuts of beef, chicken breast, [place] breast. All fish. Grill, bake, or broil your meat. Nothing should be fried.  Eat Less Often/Avoid: Fatty cuts of meat, [place], or chicken leg, thigh, or wing. Fried cuts of meat or fish. Dairy (2 to 3 servings)  Eat More Often: Low-fat or fat-free milk, low-fat plain or light yogurt, reduced-fat or part-skim cheese.  Eat Less Often/Avoid: Milk (whole, 2%).Whole milk yogurt. Full-fat cheeses. Nuts, Seeds, and Legumes (4 to 5 servings per week)  Eat More Often: All without added salt.  Eat Less Often/Avoid: Salted nuts and seeds, canned beans with added salt. Fats and Sweets (limited)  Eat More Often: Vegetable oils, tub margarines without trans fats, sugar-free gelatin. Mayonnaise and salad dressings.  Eat Less Often/Avoid: Coconut oils, palm oils, butter, stick margarine, cream, half and half, cookies, candy, pie. FOR MORE INFORMATION The Dash Diet Eating Plan: www.dashdiet.org Document Released: 07/11/2011 Document Revised: 10/14/2011 Document Reviewed: 07/11/2011 [facility] Patient Information 2014 [place], [place].   Throat Step 1- bed on 6 inch block Step 2- continue omeprazole Step 3- if no better in 3 weeks then add store brand Allegra 180 mg Step 4- if still no better after 3 weeks  Then call , we will set up ENT  Monitor BP monthly if problems follow up   Book - " Eat to Live" by Dr. Landis Martins

## 2013-01-08 NOTE — Assessment & Plan Note (Signed)
Dietary measures and exercise to control it.

## 2013-01-08 NOTE — Progress Notes (Signed)
  Subjective:    Patient ID: Dana Downs, female    DOB: 23-Jan-1981, 32 y.o.   MRN: [id number]  HPIPatient comes in today followup. Overall she feels she is doing fairly good but she took the hydrochlorothiazide it caused headaches nausea and made her feel bad. She states that she denied any numbness or tingling. This seems to be doing better now that she has stop the medicine. 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 PMH hypertension family history hypertension ROS see above    Review of Systems     Objective:   Physical Exam  Lungs are clear hearts regular pulse normal extremities no edema blood pressure taken with a large cuff  On recheck was actually very good at 120/78      Assessment & Plan:  HTN-currently I don't find evidence of ongoing hypertension. I don't feel she needs to be on medication I told her that's best for her to try to maintain a healthy diet and exercise. Get her blood pressure checked periodically through her workplace followup here within 6 months for recheck. Dietary measures discussed. No medications indicated. Morning throat irritation-she describes burning in her throat in the morning when she wakes up I told her the best thing to start off with his putting her bed on 6 inch blocks continue omeprazole most likely due to reflux if that doesn't help then try 2-3 weeks of Allegra if that doesn't help notify us and we will set her up for ENT.

## 2014-05-14 ENCOUNTER — Encounter (HOSPITAL_COMMUNITY): Payer: Self-pay | Admitting: Emergency Medicine

## 2014-05-14 ENCOUNTER — Emergency Department (HOSPITAL_COMMUNITY)
Admission: EM | Admit: 2014-05-14 | Discharge: 2014-05-15 | Disposition: A | Discharge status: Home / Self Care | Payer: BC Managed Care – PPO | Attending: Emergency Medicine | Admitting: Emergency Medicine

## 2014-05-14 DIAGNOSIS — R51 Headache: Secondary | ICD-10-CM | POA: Insufficient documentation

## 2014-05-14 DIAGNOSIS — R519 Headache, unspecified: Secondary | ICD-10-CM

## 2014-05-14 DIAGNOSIS — Z87891 Personal history of nicotine dependence: Secondary | ICD-10-CM | POA: Diagnosis not present

## 2014-05-14 DIAGNOSIS — Q899 Congenital malformation, unspecified: Secondary | ICD-10-CM | POA: Insufficient documentation

## 2014-05-14 DIAGNOSIS — M791 Myalgia: Secondary | ICD-10-CM | POA: Insufficient documentation

## 2014-05-14 DIAGNOSIS — R509 Fever, unspecified: Secondary | ICD-10-CM

## 2014-05-14 DIAGNOSIS — R531 Weakness: Secondary | ICD-10-CM | POA: Insufficient documentation

## 2014-05-14 DIAGNOSIS — B349 Viral infection, unspecified: Secondary | ICD-10-CM | POA: Diagnosis not present

## 2014-05-14 DIAGNOSIS — Z79899 Other long term (current) drug therapy: Secondary | ICD-10-CM | POA: Diagnosis not present

## 2014-05-14 DIAGNOSIS — Z880 Allergy status to penicillin: Secondary | ICD-10-CM | POA: Diagnosis not present

## 2014-05-14 DIAGNOSIS — K5900 Constipation, unspecified: Secondary | ICD-10-CM | POA: Insufficient documentation

## 2014-05-14 DIAGNOSIS — Z8632 Personal history of gestational diabetes: Secondary | ICD-10-CM | POA: Diagnosis not present

## 2014-05-14 MED ORDER — IBUPROFEN 400 MG PO TABS
600.0000 mg | ORAL_TABLET | Freq: Once | ORAL | Status: AC
  Administered 2014-05-14: 600 mg via ORAL
  Filled 2014-05-14: qty 2

## 2014-05-14 NOTE — ED Notes (Addendum)
Pt c/o fever, generalized  bodyaches, dizziness, nasal congestion,headache, chills, and weakness. Pt was placed on Cefprozil for a "sinus infection yesterday. Her temp at home was 104.0

## 2014-05-14 NOTE — ED Provider Notes (Signed)
CSN: [id number]     Arrival date & time 05/14/14  2129 History  This chart was scribed for Dana Biles, MD by Jeanell Sparrow, ED Scribe. This patient was seen in room APA18/APA18 and the patient's care was started at 11:59 PM.   Chief Complaint  Patient presents with  . Fever   The history is provided by the patient. No language interpreter was used.   HPI Comments: Dana Downs is a 33 y.o. female with a hx of sinus infection who presents to the Emergency Department complaining of a constant moderate generalized headache that started yesterday. She describes the headache as a throbbing sensation. She reports that her pain currently is a 5/10 in severity and 10 at its worst. She states that she took ibuprofen without any relief.  She reports some tingling in her bilateral upper extremities. She states that her mother died of a brain aneurysm.   Pt reports that she had fever that started yesterday. She states that her temperature at home was 103.9. Pt's temperature in the ED is 102.9. She states that she saw her pcp and was diagnosed with a left ear infection and sinus headaches, started on antibiotics. She reports some associated generalized weakness and body aches with very mild nasal congestion. She denies any postnasal drip, neck pain, neck stiffness, nausea, emesis, ear ache, sore throat, chest pain, rash, or dysuria. No recent infection of the face either. No ear ringing or vertigo or loss of hearing.  Past Medical History  Diagnosis Date  . Abnormal Pap smear   . HPV in female   . Dysplasia   . Gestational diabetes   . Headache(784.0)   . Polycystic ovarian syndrome   . Constipation, chronic   . GERD (gastroesophageal reflux disease)     heartburn with pregnancy-occasional   Past Surgical History  Procedure Laterality Date  . Tooth extraction    . Neck surgery  age 66-gland removal    atypical microbacterial infection  . Cholecystectomy  2013  . Cesarean section  09/02/2011     Procedure: CESAREAN SECTION;  Surgeon: Marylynn Pearson, MD;  Location: [facility];  Service: Gynecology;  Laterality: N/A;  Primary cesarean section   Family History  Problem Relation Age of Onset  . Hypertension Mother   . Hyperlipidemia Father   . Cancer Paternal Grandmother     breast  . Diabetes Paternal Grandmother    History  Substance Use Topics  . Smoking status: Former Smoker    Quit date: 09/18/2009  . Smokeless tobacco: Not on file  . Alcohol Use: No   OB History   Grav Para Term Preterm Abortions TAB SAB Ect Mult Living   1 1 1       1      Review of Systems  Constitutional: Positive for fever and chills.  HENT: Positive for congestion. Negative for ear pain, postnasal drip and sore throat.   Cardiovascular: Negative for chest pain.  Gastrointestinal: Negative for nausea and vomiting.  Genitourinary: Negative for dysuria.  Musculoskeletal: Positive for myalgias. Negative for neck pain and neck stiffness.  Neurological: Positive for weakness and headaches.  All other systems reviewed and are negative.   Allergies  Penicillins; Doxycycline; Penicillins; Sulfa antibiotics; and Sulphur  Home Medications   Prior to Admission medications   Medication Sig Start Date End Date Taking? Authorizing Provider  cefPROZIL (CEFZIL) 500 MG tablet Take 500 mg by mouth 2 (two) times daily. 10 day course starting on 05/13/2014 05/13/14  Yes  Historical Provider, MD  ibuprofen (ADVIL,MOTRIN) 200 MG tablet Take 600 mg by mouth 2 (two) times daily as needed for mild pain or moderate pain.   Yes Historical Provider, MD  Linaclotide (LINZESS) 145 MCG CAPS Take 145 mcg by mouth daily.   Yes Historical Provider, MD  pseudoephedrine-acetaminophen (TYLENOL SINUS) 30-500 MG TABS Take 1 tablet by mouth 2 (two) times daily as needed (for sinus).   Yes Historical Provider, MD  ibuprofen (ADVIL,MOTRIN) 600 MG tablet Take 1 tablet (600 mg total) by mouth every 6 (six) hours as needed. 05/15/14    Dana Biles, MD  traMADol (ULTRAM) 50 MG tablet Take 1 tablet (50 mg total) by mouth every 6 (six) hours as needed. 05/15/14   [name] [name], MD   BP 139/90  Pulse 94  Temp(Src) 102.9 F (39.4 C) (Oral)  Resp 20  Ht 5\' 9"  (1.753 m)  Wt 245 lb (111.131 kg)  BMI 36.16 kg/m2  SpO2 96% Physical Exam  Nursing note and vitals reviewed. Constitutional: She is oriented to person, place, and time. She appears well-developed and well-nourished. No distress.  HENT:  Head: Normocephalic and atraumatic.  Mouth/Throat: Oropharynx is clear and moist. No oropharyngeal exudate.  Left TM appears to be dull. Rt is normal  Eyes: EOM are normal. Pupils are equal, round, and reactive to light.  Pupils 5 mm reactive to light. No blurry vision.  No nystagmus  Neck: Neck supple. No tracheal deviation present.  No nuchal rigidity noted. No preauricular or postauricular adenopathy.   Cardiovascular: Normal rate, regular rhythm and normal heart sounds.  Exam reveals no gallop and no friction rub.   No murmur heard. Pulmonary/Chest: Effort normal and breath sounds normal. No respiratory distress. She has no wheezes. She has no rales.  Abdominal: She exhibits no distension. There is no tenderness.  Musculoskeletal: Normal range of motion.  Lymphadenopathy:    She has no cervical adenopathy.  Neurological: She is alert and oriented to person, place, and time. No cranial nerve deficit. Coordination normal.  Cranial nerves 2 12 intact. Upper and lower sensations and strength normal.  Finger to nose cerebellar exam normal.   Skin: Skin is warm and dry.  Psychiatric: She has a normal mood and affect. Her behavior is normal.    ED Course  Procedures (including critical care time) DIAGNOSTIC STUDIES: Oxygen Saturation is 96% on RA, normal by my interpretation.    COORDINATION OF CARE: 12:03 AM- Pt advised of plan for treatment which includes medication and pt agrees.  Labs Review Labs Reviewed  BASIC  METABOLIC PANEL - Abnormal; Notable for the following:    Potassium 2.6 (*)    Glucose, Bld 172 (*)    Anion gap 17 (*)    All other components within normal limits  CBC WITH DIFFERENTIAL  LACTIC ACID, PLASMA    Imaging Review Ct Head Wo Contrast  05/15/2014   CLINICAL DATA:  Acute onset of constant moderate generalized headache, starting yesterday, with throbbing sensation and tingling in the arms. Fever. Generalized weakness, body aches and nasal congestion. Initial encounter.  EXAM: CT HEAD WITHOUT CONTRAST  TECHNIQUE: Contiguous axial images were obtained from the base of the skull through the vertex without intravenous contrast.  COMPARISON:  None.  FINDINGS: There is no evidence of acute infarction, mass lesion, or intra- or extra-axial hemorrhage on CT.  The posterior fossa, including the cerebellum, brainstem and fourth ventricle, is within normal limits. The third and lateral ventricles, and basal ganglia are unremarkable in  appearance. The cerebral hemispheres are symmetric in appearance, with normal gray-white differentiation. No mass effect or midline shift is seen.  There is no evidence of fracture; visualized osseous structures are unremarkable in appearance. The visualized portions of the orbits are within normal limits. The paranasal sinuses and mastoid air cells are well-aerated. No significant soft tissue abnormalities are seen.  IMPRESSION: Unremarkable noncontrast CT of the head.   Electronically Signed   By: Garald Balding M.D.   On: 05/15/2014 01:36     EKG Interpretation   Date/Time:  Sunday May 15 2014 01:36:07 EDT Ventricular Rate:  79 PR Interval:  144 QRS Duration: 100 QT Interval:  384 QTC Calculation: 440 R Axis:   42 Text Interpretation:  Sinus rhythm Baseline wander in lead(s) V4 V5  Confirmed by [name], MD, Lupie Sawa ([id number]) on 05/15/2014 1:43:23 AM        2:47 AM Pt's labs are reassuring.  CT head is normal. Fever responded, pain improved  dramatically with Toradol and Reglan. Pt and husband agree that she is a lot better. Warning signs discussed verbally, and will be written in the discharge paperwork. Potasium replaced.   MDM   Final diagnoses:  Viral syndrome  Acute intractable headache, unspecified headache type  Fever, unspecified fever cause   I personally performed the services described in this documentation, which was scribed in my presence. The recorded information has been reviewed and is accurate.  DDX includes: Primary headaches - including migrainous headaches, cluster headaches, tension headaches. ICH Carotid dissection Cavernous sinus thrombosis Meningitis Encephalitis Sinusitis Tumor Vascular headaches AV malformation Brain aneurysm Muscular headaches Psuedotumor cerebri  A/P: Pt comes in with cc of headaches. Pt has a fever as well.  She was started on antibiotics for sinusitis, and OM  - based on exam and description of the headache. However, she has no ear complains, and there is mild congestion only - so those etiologies are not high on the ddx clinically.  CT head was obtained, as pt states that her headache is worse when leaning forward or laying supine, and there is fam hx of AN (grand mother). We also wanted to make sure there was no signs of infection/inflammation/edema of the brain.  Labs were ordered as well, and are WNL.  Plan was to get LP if labs or CT were concerning, and/or if pt was getting worse. None of these however are present, and pt feels a lot better.  Pt is reliable, and there with her husband. Return precautions discussed. I suspect possible viral syndrome, even meningitis. Other possibility is psuedotumor cerebri. Informed her that i work tomorrow night, and to come back to the ER without hesitation if sx are getting worse.  Hypokalemia - potassium replaced.  Dana Biles, MD 05/15/14 [phone]

## 2014-05-15 ENCOUNTER — Emergency Department (HOSPITAL_COMMUNITY): Payer: BC Managed Care – PPO

## 2014-05-15 LAB — BASIC METABOLIC PANEL
ANION GAP: 17 — AB (ref 5–15)
BUN: 8 mg/dL (ref 6–23)
CHLORIDE: 98 meq/L (ref 96–112)
CO2: 22 meq/L (ref 19–32)
Calcium: 9 mg/dL (ref 8.4–10.5)
Creatinine, Ser: 0.82 mg/dL (ref 0.50–1.10)
GFR calc non Af Amer: >90 mL/min (ref >90)
Glucose, Bld: 172 mg/dL — ABNORMAL HIGH (ref 70–99)
POTASSIUM: 2.6 meq/L — AB (ref 3.7–5.3)
Sodium: 137 mEq/L (ref 137–147)

## 2014-05-15 LAB — CBC WITH DIFFERENTIAL/PLATELET
BASOS ABS: 0 10*3/uL (ref 0.0–0.1)
BASOS PCT: 0 % (ref 0–1)
Eosinophils Absolute: 0 10*3/uL (ref 0.0–0.7)
Eosinophils Relative: 0 % (ref 0–5)
HEMATOCRIT: 38.1 % (ref 36.0–46.0)
HEMOGLOBIN: 13.6 g/dL (ref 12.0–15.0)
LYMPHS PCT: 21 % (ref 12–46)
Lymphs Abs: 1.5 10*3/uL (ref 0.7–4.0)
MCH: 32.2 pg (ref 26.0–34.0)
MCHC: 35.7 g/dL (ref 30.0–36.0)
MCV: 90.3 fL (ref 78.0–100.0)
MONO ABS: 0.3 10*3/uL (ref 0.1–1.0)
Monocytes Relative: 4 % (ref 3–12)
NEUTROS ABS: 5.2 10*3/uL (ref 1.7–7.7)
NEUTROS PCT: 75 % (ref 43–77)
Platelets: 238 10*3/uL (ref 150–400)
RBC: 4.22 MIL/uL (ref 3.87–5.11)
RDW: 12.6 % (ref 11.5–15.5)
WBC: 7 10*3/uL (ref 4.0–10.5)

## 2014-05-15 LAB — LACTIC ACID, PLASMA: LACTIC ACID, VENOUS: 1.5 mmol/L (ref 0.5–2.2)

## 2014-05-15 IMAGING — CT CT HEAD W/O CM
1 series · 15 of 30 positions shown, 19 images · non-contrast
Comparison: None.

CLINICAL DATA: Acute onset of constant moderate generalized
headache, starting yesterday, with throbbing sensation and tingling
in the arms. Fever. Generalized weakness, body aches and nasal
congestion. Initial encounter.

EXAM:
CT HEAD WITHOUT CONTRAST
TECHNIQUE: Contiguous axial images were obtained from the base of the skull
through the vertex without intravenous contrast.

[Series 2: headseq 4.8 h37s · axial · 0.43mm/px · z∈[+1260,+1425]mm · 15 of 36 slices shown, 19 images]
[im 2/36  brain]
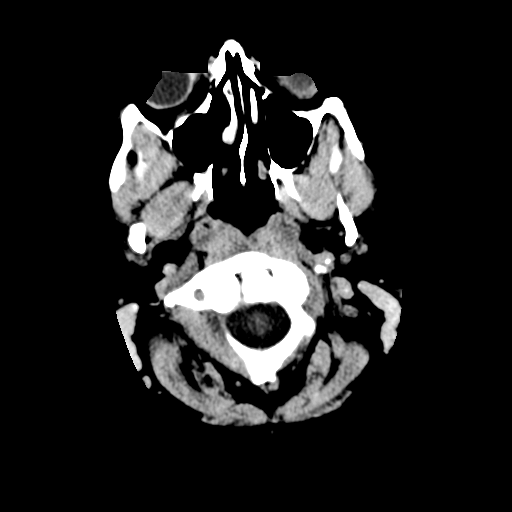
[im 2/36  bone]
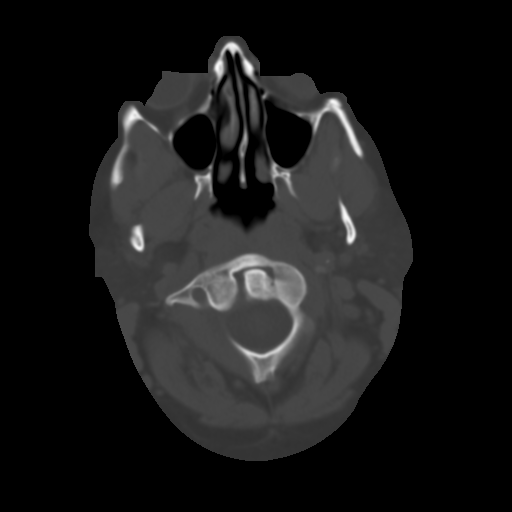
[im 4/36  brain]
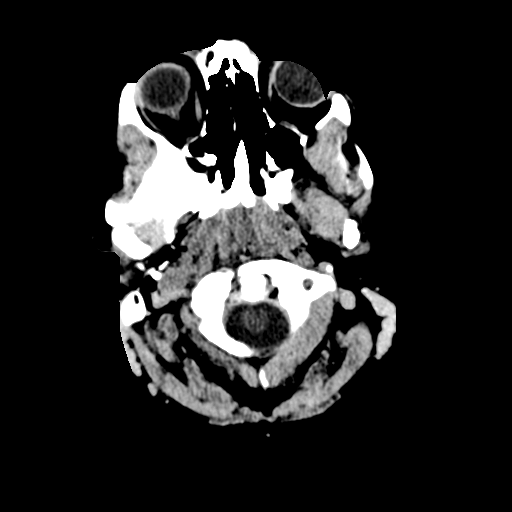
[im 7/36  brain]
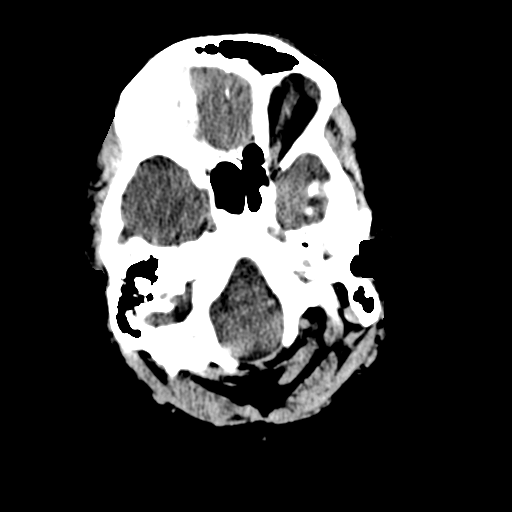
[im 9/36  brain]
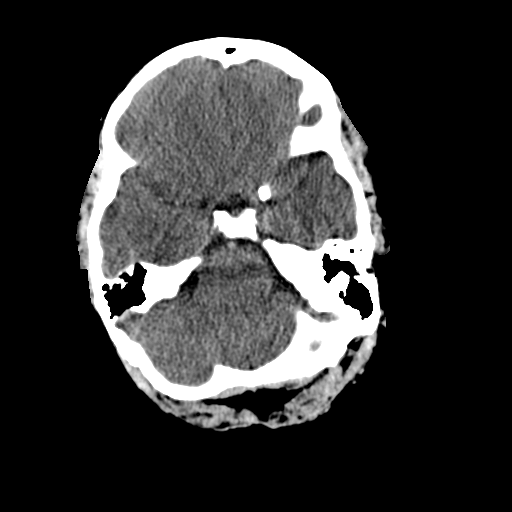
[im 11/36  brain]
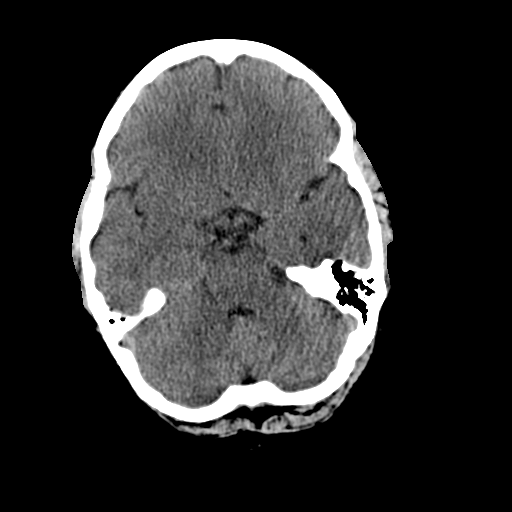
[im 11/36  bone]
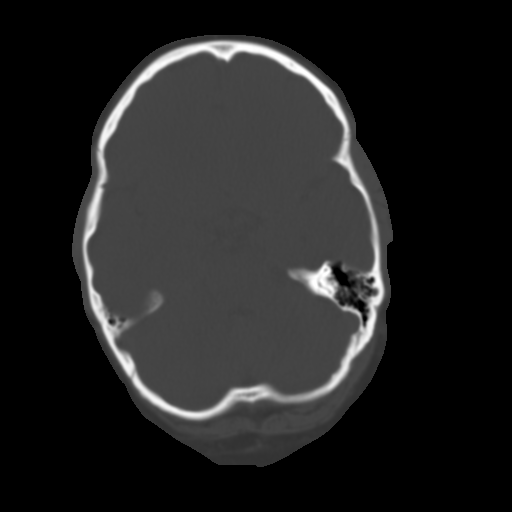
[im 14/36  brain]
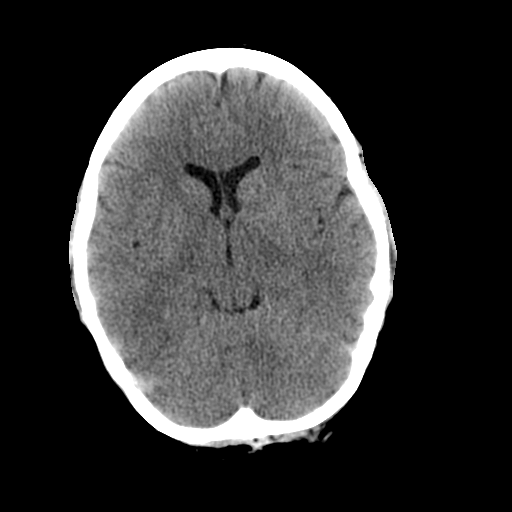
[im 16/36  brain]
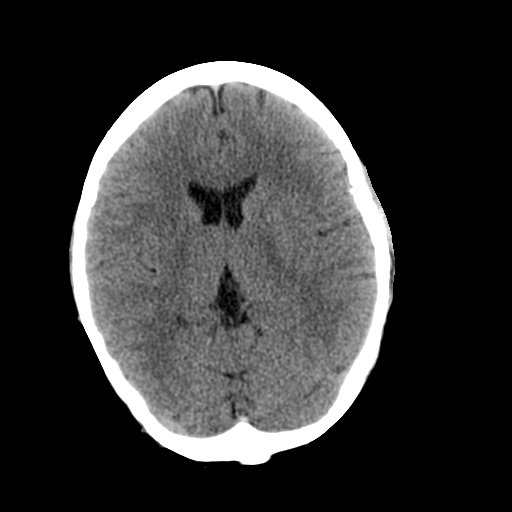
[im 19/36  brain]
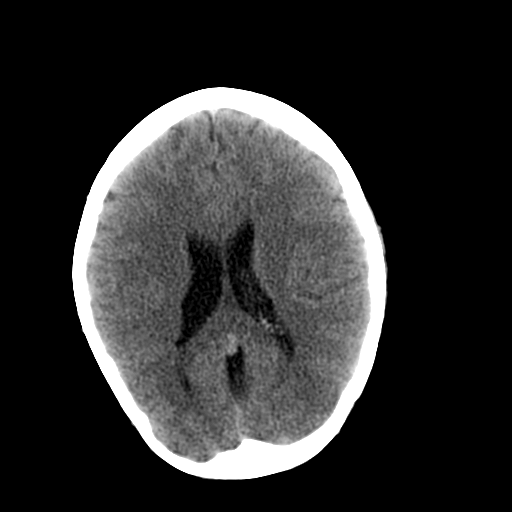
[im 20/36  brain]
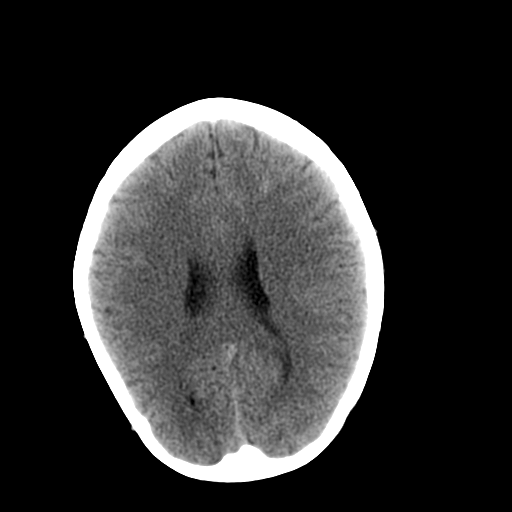
[im 20/36  bone]
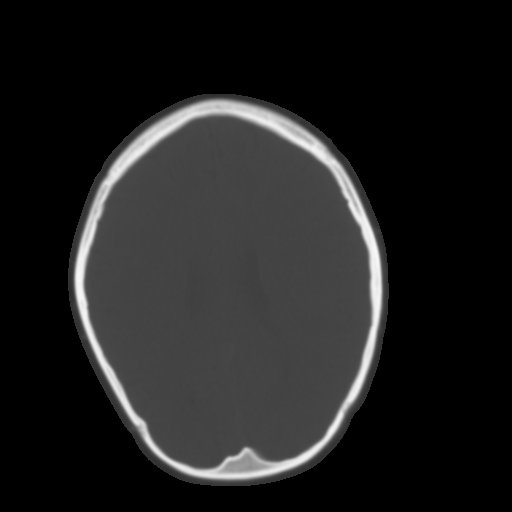
[im 22/36  brain]
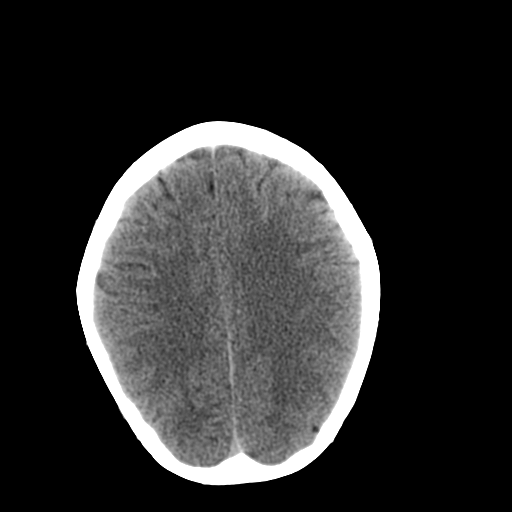
[im 25/36  brain]
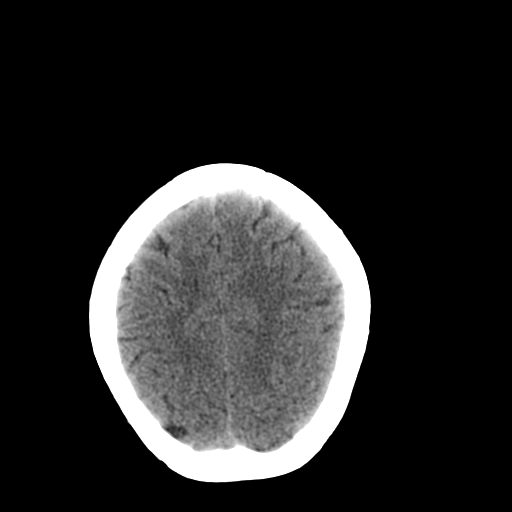
[im 27/36  brain]
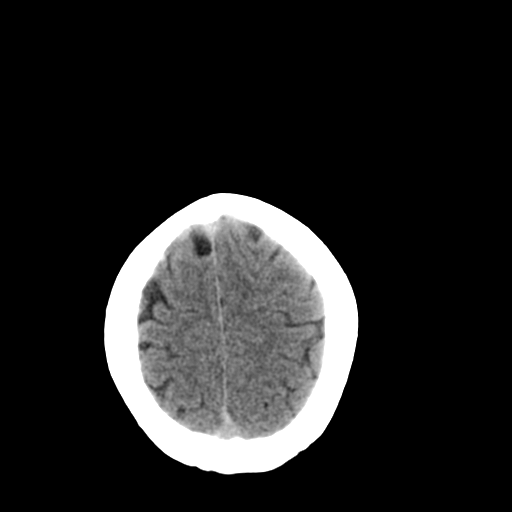
[im 29/36  brain]
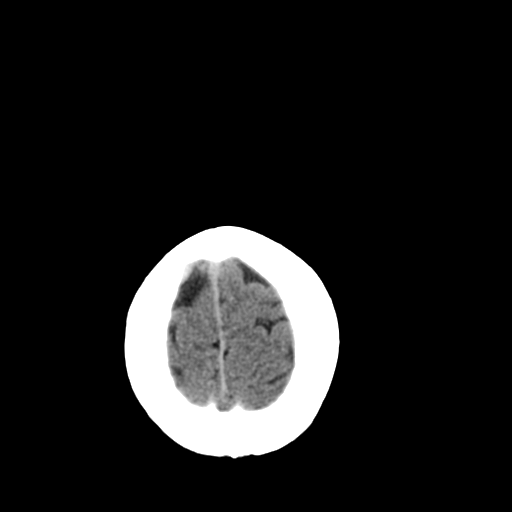
[im 29/36  bone]
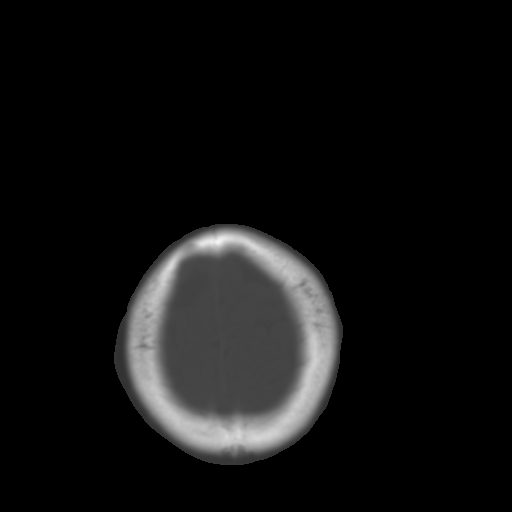
[im 32/36  brain]
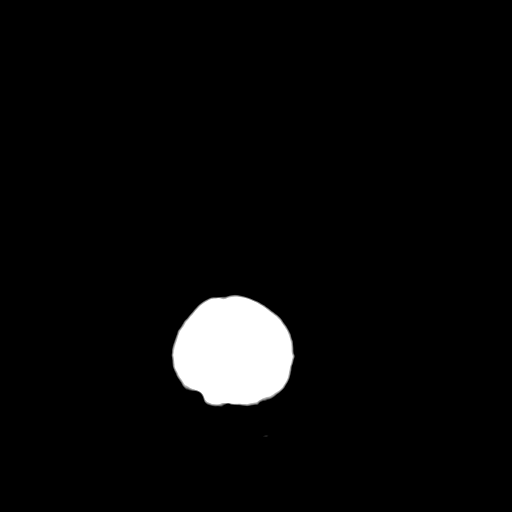
[im 34/36  brain]
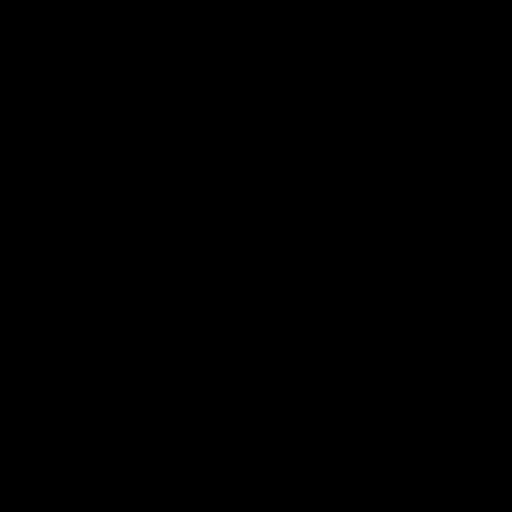

[15 of 30 positions shown; findings below may reference images not displayed]

FINDINGS: There is no evidence of acute infarction, mass lesion, or intra- or
extra-axial hemorrhage on CT.

The posterior fossa, including the cerebellum, brainstem and fourth
ventricle, is within normal limits. The third and lateral
ventricles, and basal ganglia are unremarkable in appearance. The
cerebral hemispheres are symmetric in appearance, with normal
gray-white differentiation. No mass effect or midline shift is seen.

There is no evidence of fracture; visualized osseous structures are
unremarkable in appearance. The visualized portions of the orbits
are within normal limits. The paranasal sinuses and mastoid air
cells are well-aerated. No significant soft tissue abnormalities are
seen.
IMPRESSION: Unremarkable noncontrast CT of the head.

## 2014-05-15 MED ORDER — METOCLOPRAMIDE HCL 5 MG/ML IJ SOLN
10.0000 mg | Freq: Once | INTRAMUSCULAR | Status: AC
  Administered 2014-05-15: 10 mg via INTRAVENOUS
  Filled 2014-05-15: qty 2

## 2014-05-15 MED ORDER — KETOROLAC TROMETHAMINE 30 MG/ML IJ SOLN
30.0000 mg | Freq: Once | INTRAMUSCULAR | Status: AC
  Administered 2014-05-15: 30 mg via INTRAVENOUS
  Filled 2014-05-15: qty 1

## 2014-05-15 MED ORDER — POTASSIUM CHLORIDE CRYS ER 20 MEQ PO TBCR
40.0000 meq | EXTENDED_RELEASE_TABLET | Freq: Once | ORAL | Status: AC
  Administered 2014-05-15: 40 meq via ORAL
  Filled 2014-05-15: qty 2

## 2014-05-15 MED ORDER — POTASSIUM CHLORIDE CRYS ER 20 MEQ PO TBCR
60.0000 meq | EXTENDED_RELEASE_TABLET | Freq: Once | ORAL | Status: AC
  Administered 2014-05-15: 60 meq via ORAL
  Filled 2014-05-15: qty 3

## 2014-05-15 MED ORDER — IBUPROFEN 600 MG PO TABS: 600.0000 mg | ORAL_TABLET | Freq: Four times a day (QID) | ORAL | Status: DC | PRN

## 2014-05-15 MED ORDER — TRAMADOL HCL 50 MG PO TABS: 50.0000 mg | ORAL_TABLET | Freq: Four times a day (QID) | ORAL | Status: DC | PRN

## 2014-05-15 MED ORDER — LACTATED RINGERS IV BOLUS (SEPSIS)
1000.0000 mL | Freq: Once | INTRAVENOUS | Status: AC
  Administered 2014-05-15: 1000 mL via INTRAVENOUS

## 2014-05-15 NOTE — Discharge Instructions (Signed)
We saw you in the ER for headaches and fevers.  All the labs and imaging are normal. We are not sure what is causing your headaches, however, there appears to be no evidence of infection, bleeds or tumors based on our exam and results.  Please take motrin round the clock for the next 6 hours, and take other meds prescribed only for break through pain. See your doctor if the pain persists, as you might need better medications or a specialist.  RETURN PRECAUTIONS: Please return to the ER if your symptoms worsen; you have increased pain, fevers, chills, inability to keep any medications down, confusion, seizures, vision complains, ear ringing, dizziness/spinning, vision complains. Otherwise see the outpatient doctor as requested.   General Headache Without Cause A headache is pain or discomfort felt around the head or neck area. The specific cause of a headache may not be found. There are many causes and types of headaches. A few common ones are:  Tension headaches.  Migraine headaches.  Cluster headaches.  Chronic daily headaches. HOME CARE INSTRUCTIONS   Keep all follow-up appointments with your caregiver or any specialist referral.  Only take over-the-counter or prescription medicines for pain or discomfort as directed by your caregiver.  Lie down in a dark, quiet room when you have a headache.  Keep a headache journal to find out what may trigger your migraine headaches. For example, write down:  What you eat and drink.  How much sleep you get.  Any change to your diet or medicines.  Try massage or other relaxation techniques.  Put ice packs or heat on the head and neck. Use these 3 to 4 times per day for 15 to 20 minutes each time, or as needed.  Limit stress.  Sit up straight, and do not tense your muscles.  Quit smoking if you smoke.  Limit alcohol use.  Decrease the amount of caffeine you drink, or stop drinking caffeine.  Eat and sleep on a regular  schedule.  Get 7 to 9 hours of sleep, or as recommended by your caregiver.  Keep lights dim if bright lights bother you and make your headaches worse. SEEK MEDICAL CARE IF:   You have problems with the medicines you were prescribed.  Your medicines are not working.  You have a change from the usual headache.  You have nausea or vomiting. SEEK IMMEDIATE MEDICAL CARE IF:   Your headache becomes severe.  You have a fever.  You have a stiff neck.  You have loss of vision.  You have muscular weakness or loss of muscle control.  You start losing your balance or have trouble walking.  You feel faint or pass out.  You have severe symptoms that are different from your first symptoms. MAKE SURE YOU:   Understand these instructions.  Will watch your condition.  Will get help right away if you are not doing well or get worse. Document Released: 07/22/2005 Document Revised: 10/14/2011 Document Reviewed: 08/07/2011 [facility] Patient Information 2015 [place], [place]. This information is not intended to replace advice given to you by your health care provider. Make sure you discuss any questions you have with your health care provider.  Fever, Adult A fever is a higher than normal body temperature. In an adult, an oral temperature around 98.6 F (37 C) is considered normal. A temperature of 100.4 F (38 C) or higher is generally considered a fever. Mild or moderate fevers generally have no long-term effects and often do not require treatment. Extreme  fever (greater than or equal to 106 F or 41.1 C) can cause seizures. The sweating that may occur with repeated or prolonged fever may cause dehydration. Elderly people can develop confusion during a fever. A measured temperature can vary with:  Age.  Time of day.  Method of measurement (mouth, underarm, rectal, or ear). The fever is confirmed by taking a temperature with a thermometer. Temperatures can be taken different ways. Some  methods are accurate and some are not.  An oral temperature is used most commonly. Electronic thermometers are fast and accurate.  An ear temperature will only be accurate if the thermometer is positioned as recommended by the manufacturer.  A rectal temperature is accurate and done for those adults who have a condition where an oral temperature cannot be taken.  An underarm (axillary) temperature is not accurate and not recommended. Fever is a symptom, not a disease.  CAUSES   Infections commonly cause fever.  Some noninfectious causes for fever include:  Some arthritis conditions.  Some thyroid or adrenal gland conditions.  Some immune system conditions.  Some types of cancer.  A medicine reaction.  High doses of certain street drugs such as methamphetamine.  Dehydration.  Exposure to high outside or room temperatures.  Occasionally, the source of a fever cannot be determined. This is sometimes called a "fever of unknown origin" (FUO).  Some situations may lead to a temporary rise in body temperature that may go away on its own. Examples are:  Childbirth.  Surgery.  Intense exercise. HOME CARE INSTRUCTIONS   Take appropriate medicines for fever. Follow dosing instructions carefully. If you use acetaminophen to reduce the fever, be careful to avoid taking other medicines that also contain acetaminophen. Do not take aspirin for a fever if you are younger than age 91. There is an association with Reye's syndrome. Reye's syndrome is a rare but potentially deadly disease.  If an infection is present and antibiotics have been prescribed, take them as directed. Finish them even if you start to feel better.  Rest as needed.  Maintain an adequate fluid intake. To prevent dehydration during an illness with prolonged or recurrent fever, you may need to drink extra fluid.Drink enough fluids to keep your urine clear or pale yellow.  Sponging or bathing with room temperature  water may help reduce body temperature. Do not use ice water or alcohol sponge baths.  Dress comfortably, but do not over-bundle. SEEK MEDICAL CARE IF:   You are unable to keep fluids down.  You develop vomiting or diarrhea.  You are not feeling at least partly better after 3 days.  You develop new symptoms or problems. SEEK IMMEDIATE MEDICAL CARE IF:   You have shortness of breath or trouble breathing.  You develop excessive weakness.  You are dizzy or you faint.  You are extremely thirsty or you are making little or no urine.  You develop new pain that was not there before (such as in the head, neck, chest, back, or abdomen).  You have persistent vomiting and diarrhea for more than 1 to 2 days.  You develop a stiff neck or your eyes become sensitive to light.  You develop a skin rash.  You have a fever or persistent symptoms for more than 2 to 3 days.  You have a fever and your symptoms suddenly get worse. MAKE SURE YOU:   Understand these instructions.  Will watch your condition.  Will get help right away if you are not doing well or  get worse. Document Released: 01/15/2001 Document Revised: 12/06/2013 Document Reviewed: 05/23/2011 [facility] Patient Information 2015 [place], [place]. This information is not intended to replace advice given to you by your health care provider. Make sure you discuss any questions you have with your health care provider.

## 2014-05-15 NOTE — ED Notes (Signed)
CRITICAL VALUE ALERT  Critical value received: potassium 2.6  Date of notification: 05/15/14  Time of notification: 0123 Critical value read back:yes  Nurse who received alert: smoore rn  MD notified (1st page):dr nanavonte  Time of first page: 0124  MD notified (2nd page):  Time of second page:  Responding MD: dr York Ram Time MD responded: [phone]

## 2014-05-16 ENCOUNTER — Ambulatory Visit (INDEPENDENT_AMBULATORY_CARE_PROVIDER_SITE_OTHER): Payer: BC Managed Care – PPO | Admitting: Family Medicine

## 2014-05-16 ENCOUNTER — Encounter: Payer: Self-pay | Admitting: Family Medicine

## 2014-05-16 VITALS — BP 124/88 | Temp 97.5°F | Ht 69.0 in | Wt 249.0 lb

## 2014-05-16 DIAGNOSIS — R509 Fever, unspecified: Secondary | ICD-10-CM

## 2014-05-16 DIAGNOSIS — E876 Hypokalemia: Secondary | ICD-10-CM

## 2014-05-16 DIAGNOSIS — R739 Hyperglycemia, unspecified: Secondary | ICD-10-CM

## 2014-05-16 LAB — POCT GLYCOSYLATED HEMOGLOBIN (HGB A1C): HEMOGLOBIN A1C: 4.5

## 2014-05-16 NOTE — Progress Notes (Signed)
   Subjective:    Patient ID: Dana Downs, female    DOB: 04/05/1981, 33 y.o.   MRN: [id number]  Headache  This is a new problem. The current episode started in the past 7 days. The problem occurs intermittently. The problem has been unchanged. The pain does not radiate. The pain is moderate. Associated symptoms include a fever and muscle aches. Pertinent negatives include no coughing, ear pain or rhinorrhea. Nothing aggravates the symptoms. Treatments tried: cefzil. The treatment provided no relief.   Patient was seen at [facility] on 05/14/14 for this issue.  Patient states that she is very jittery and unsteady gait.  This patient initially started last week went to a different doctor they diagnosed her with a sinus illness put her on antibiotics even know she had mainly headache fever and not feeling good she went to the ER they did a CAT scan lab work for now low potassium she follows up here today stating she does feel better compared to Friday and Saturday but not feeling great she denies neck stiffness unilateral numbness or weakness Friday started  Review of Systems  Constitutional: Positive for fever. Negative for activity change.  HENT: Negative for congestion, ear pain and rhinorrhea.   Eyes: Negative for discharge.  Respiratory: Negative for cough, shortness of breath and wheezing.   Cardiovascular: Negative for chest pain.  Neurological: Positive for headaches.       Objective:   Physical Exam  Nursing note and vitals reviewed. Constitutional: She appears well-developed.  HENT:  Head: Normocephalic.  Nose: Nose normal.  Mouth/Throat: Oropharynx is clear and moist. No oropharyngeal exudate.  Neck: Neck supple.  Cardiovascular: Normal rate and normal heart sounds.   No murmur heard. Pulmonary/Chest: Effort normal and breath sounds normal. She has no wheezes.  Lymphadenopathy:    She has no cervical adenopathy.  Skin: Skin is warm and dry.          Assessment &  Plan:  Viral syndrome/febrile illness-I really doubt that this is a bacterial illness. I do not find evidence of meningitis. I warned her regarding severe stiff neck high fevers or progressive illness to followup immediately here or in ER. I also recommend rechecking blood work. This should gradually get better. He neither physician started her on sats still I told her she could take that for 7 days then if no sinus symptoms may stop this.  #2 hypokalemia -- this patient needs a repeat metabolic 7. I doubt she has renal tubular acidosis. It is probably related to her sickness but we will check her met 7

## 2014-05-18 ENCOUNTER — Other Ambulatory Visit: Payer: Self-pay | Admitting: *Deleted

## 2014-05-18 DIAGNOSIS — Z79899 Other long term (current) drug therapy: Secondary | ICD-10-CM

## 2014-05-18 LAB — CBC WITH DIFFERENTIAL/PLATELET
BASOS ABS: 0 10*3/uL (ref 0.0–0.1)
Basophils Relative: 0 % (ref 0–1)
EOS PCT: 2 % (ref 0–5)
Eosinophils Absolute: 0.1 10*3/uL (ref 0.0–0.7)
HCT: 39.6 % (ref 36.0–46.0)
Hemoglobin: 13.5 g/dL (ref 12.0–15.0)
LYMPHS ABS: 3.5 10*3/uL (ref 0.7–4.0)
LYMPHS PCT: 47 % — AB (ref 12–46)
MCH: 31.3 pg (ref 26.0–34.0)
MCHC: 34.1 g/dL (ref 30.0–36.0)
MCV: 91.9 fL (ref 78.0–100.0)
Monocytes Absolute: 0.7 10*3/uL (ref 0.1–1.0)
Monocytes Relative: 9 % (ref 3–12)
NEUTROS ABS: 3.1 10*3/uL (ref 1.7–7.7)
NEUTROS PCT: 42 % — AB (ref 43–77)
PLATELETS: 270 10*3/uL (ref 150–400)
RBC: 4.31 MIL/uL (ref 3.87–5.11)
RDW: 13 % (ref 11.5–15.5)
WBC: 7.4 10*3/uL (ref 4.0–10.5)

## 2014-05-18 LAB — BASIC METABOLIC PANEL
BUN: 8 mg/dL (ref 6–23)
CHLORIDE: 101 meq/L (ref 96–112)
CO2: 27 mEq/L (ref 19–32)
CREATININE: 0.8 mg/dL (ref 0.50–1.10)
Calcium: 8.8 mg/dL (ref 8.4–10.5)
Glucose, Bld: 80 mg/dL (ref 70–99)
Potassium: 3.3 mEq/L — ABNORMAL LOW (ref 3.5–5.3)
SODIUM: 140 meq/L (ref 135–145)

## 2014-05-18 MED ORDER — POTASSIUM CHLORIDE CRYS ER 20 MEQ PO TBCR: EXTENDED_RELEASE_TABLET | ORAL | Status: DC

## 2014-06-06 ENCOUNTER — Encounter: Payer: Self-pay | Admitting: Family Medicine

## 2014-09-15 ENCOUNTER — Encounter: Payer: Self-pay | Admitting: Nurse Practitioner

## 2014-09-15 ENCOUNTER — Ambulatory Visit (INDEPENDENT_AMBULATORY_CARE_PROVIDER_SITE_OTHER): Payer: BLUE CROSS/BLUE SHIELD | Admitting: Nurse Practitioner

## 2014-09-15 ENCOUNTER — Encounter: Payer: Self-pay | Admitting: Family Medicine

## 2014-09-15 VITALS — BP 128/80 | Temp 98.6°F | Ht 69.0 in | Wt 249.4 lb

## 2014-09-15 DIAGNOSIS — J209 Acute bronchitis, unspecified: Secondary | ICD-10-CM

## 2014-09-15 DIAGNOSIS — J069 Acute upper respiratory infection, unspecified: Secondary | ICD-10-CM

## 2014-09-15 DIAGNOSIS — R062 Wheezing: Secondary | ICD-10-CM

## 2014-09-15 MED ORDER — ALBUTEROL SULFATE HFA 108 (90 BASE) MCG/ACT IN AERS: 2.0000 | INHALATION_SPRAY | RESPIRATORY_TRACT | Status: DC | PRN

## 2014-09-15 MED ORDER — PREDNISONE 20 MG PO TABS: ORAL_TABLET | ORAL | Status: DC

## 2014-09-15 MED ORDER — ALBUTEROL SULFATE (2.5 MG/3ML) 0.083% IN NEBU
2.5000 mg | INHALATION_SOLUTION | Freq: Once | RESPIRATORY_TRACT | Status: AC
  Administered 2014-09-15: 2.5 mg via RESPIRATORY_TRACT

## 2014-09-15 MED ORDER — HYDROCODONE-HOMATROPINE 5-1.5 MG/5ML PO SYRP: 5.0000 mL | ORAL_SOLUTION | ORAL | Status: DC | PRN

## 2014-09-16 ENCOUNTER — Encounter: Payer: Self-pay | Admitting: Nurse Practitioner

## 2014-09-16 NOTE — Progress Notes (Signed)
Subjective:  Presents for complaints of congestion sore throat and bad cough that began yesterday. Has been on clindamycin for a separate issue since 2/3. Clear runny nose. Headache. Body aches. Max temp 101. Scratchy throat. No ear pain. Some wheezing at times. Nonsmoker for the past 5 years. Taking fluids well. Voiding normal limit.  Objective:   BP 128/80 mmHg  Temp(Src) 98.6 F (37 C) (Oral)  Ht 5\' 9"  (1.753 m)  Wt 249 lb 6 oz (113.116 kg)  BMI 36.81 kg/m2 NAD. Alert, oriented. TMs clear effusion, no erythema. Pharynx clear. Neck supple with mild soft anterior adenopathy. Lungs faint expiratory wheezes noted throughout lung fields, no tachypnea. Normal color. Heart regular rate rhythm. Given albuterol 2.5 mg nebulizer treatment. Wheezing completely resolved with subjective improvement of symptoms.  Assessment: Viral URI  Acute bronchitis, unspecified organism  Wheezing - Plan: albuterol (PROVENTIL) (2.5 MG/3ML) 0.083% nebulizer solution 2.5 mg  Plan:  Meds ordered this encounter  Medications  . clindamycin (CLEOCIN) 150 MG capsule    Sig: Take 300 mg by mouth every 8 (eight) hours. For 10 days  . albuterol (PROVENTIL) (2.5 MG/3ML) 0.083% nebulizer solution 2.5 mg    Sig:   . predniSONE (DELTASONE) 20 MG tablet    Sig: 3 po qd x 3 d then 2 po qd x 3 d then 1 po qd x 3 d    Dispense:  18 tablet    Refill:  0    Order Specific Question:  Supervising Provider    Answer:  Mikey Kirschner [[id number]]  . HYDROcodone-homatropine (HYCODAN) 5-1.5 MG/5ML syrup    Sig: Take 5 mLs by mouth every 4 (four) hours as needed.    Dispense:  120 mL    Refill:  0    Order Specific Question:  Supervising Provider    Answer:  Mikey Kirschner [[id number]]  . albuterol (PROVENTIL HFA;VENTOLIN HFA) 108 (90 BASE) MCG/ACT inhaler    Sig: Inhale 2 puffs into the lungs every 4 (four) hours as needed.    Dispense:  1 Inhaler    Refill:  0    Order Specific Question:  Supervising Provider    Answer:  Mikey Kirschner [[id number]]   OTC meds as directed for daytime cough. Warning signs reviewed. Call back in 4 days if no improvement, call or go to ED over the weekend if worse. Complete clindamycin as directed.

## 2014-10-04 ENCOUNTER — Encounter: Payer: Self-pay | Admitting: Family Medicine

## 2015-01-10 ENCOUNTER — Encounter: Payer: Self-pay | Admitting: Family Medicine

## 2015-01-10 ENCOUNTER — Ambulatory Visit (INDEPENDENT_AMBULATORY_CARE_PROVIDER_SITE_OTHER): Payer: BLUE CROSS/BLUE SHIELD | Admitting: Family Medicine

## 2015-01-10 VITALS — BP 112/80 | Temp 97.9°F | Ht 69.0 in | Wt 245.5 lb

## 2015-01-10 DIAGNOSIS — J019 Acute sinusitis, unspecified: Secondary | ICD-10-CM

## 2015-01-10 DIAGNOSIS — B9689 Other specified bacterial agents as the cause of diseases classified elsewhere: Secondary | ICD-10-CM

## 2015-01-10 DIAGNOSIS — R6889 Other general symptoms and signs: Secondary | ICD-10-CM

## 2015-01-10 MED ORDER — LEVOFLOXACIN 500 MG PO TABS: 500.0000 mg | ORAL_TABLET | Freq: Every day | ORAL | Status: DC

## 2015-01-10 NOTE — Progress Notes (Signed)
   Subjective:    Patient ID: Dana Downs, female    DOB: November 20, 1980, 34 y.o.   MRN: [id number]  Sinusitis This is a new problem. The current episode started in the past 7 days. The problem is unchanged. There has been no fever. The pain is moderate. Associated symptoms include congestion, headaches and a sore throat. (Diarrhea, body aches) Past treatments include oral decongestants. The treatment provided no relief.   Patient states that she had a tick bite a couple of weeks ago. She is not had any fever or rash since that time  No other concerns at this time.    Review of Systems  HENT: Positive for congestion and sore throat.   Neurological: Positive for headaches.   she does relate congestion coughing she relates discolored drainage and sore throat along with some body aches     Objective:   Physical Exam  Subjective sinus pressure in the frontal sinuses eardrums normal throat normal congestion is noted cough is noted her lungs are clear not respiratory distress No rash     Assessment & Plan:  Viral syndrome Secondary sinusitis Levaquin 10 days If progressive symptoms occur follow-up  I doubt that this is related to the tick bite certainly tick bites can cause body aches and feel bad but the fact that she had the body aches head congestion discolored mucus coughing all come on over a 48-hour span points more toward a respiratory illness if she gets worse she needs to notify us.

## 2015-07-04 ENCOUNTER — Encounter: Payer: Self-pay | Admitting: Family Medicine

## 2015-07-04 ENCOUNTER — Ambulatory Visit (INDEPENDENT_AMBULATORY_CARE_PROVIDER_SITE_OTHER): Payer: BLUE CROSS/BLUE SHIELD | Admitting: Family Medicine

## 2015-07-04 VITALS — Temp 98.2°F | Ht 69.0 in | Wt 246.8 lb

## 2015-07-04 DIAGNOSIS — K602 Anal fissure, unspecified: Secondary | ICD-10-CM

## 2015-07-04 DIAGNOSIS — B9689 Other specified bacterial agents as the cause of diseases classified elsewhere: Secondary | ICD-10-CM

## 2015-07-04 DIAGNOSIS — K5901 Slow transit constipation: Secondary | ICD-10-CM

## 2015-07-04 DIAGNOSIS — K5900 Constipation, unspecified: Secondary | ICD-10-CM | POA: Insufficient documentation

## 2015-07-04 DIAGNOSIS — J019 Acute sinusitis, unspecified: Secondary | ICD-10-CM

## 2015-07-04 MED ORDER — LINACLOTIDE 145 MCG PO CAPS: 145.0000 ug | ORAL_CAPSULE | Freq: Every day | ORAL | Status: DC

## 2015-07-04 MED ORDER — LEVOFLOXACIN 500 MG PO TABS: 500.0000 mg | ORAL_TABLET | Freq: Every day | ORAL | Status: DC

## 2015-07-04 NOTE — Progress Notes (Signed)
   Subjective:    Patient ID: Dana Downs, female    DOB: March 06, 1981, 34 y.o.   MRN: [id number]  Cough This is a new problem. The current episode started yesterday. Associated symptoms include headaches, nasal congestion, rhinorrhea and a sore throat. Pertinent negatives include no chest pain, ear pain, fever, shortness of breath or wheezing. Treatments tried: sinus/cold, vicks humidifer.   PMH benign head congestion drainage coughing not feeling symptoms over the past few days worse today with severe sinus pain   Review of Systems  Constitutional: Negative for fever and activity change.  HENT: Positive for congestion, rhinorrhea and sore throat. Negative for ear pain.   Eyes: Negative for discharge.  Respiratory: Positive for cough. Negative for shortness of breath and wheezing.   Cardiovascular: Negative for chest pain.  Neurological: Positive for headaches.  patient also has constipation which is causing anal fissure she would use requesting medicine that her gastroenterologist at her on she is tried multiple medicines including over-the-counter lactulose as well as MiraLAX without any success she is had severe constipation ever since being young child     Objective:   Physical Exam  Constitutional: She appears well-developed.  HENT:  Head: Normocephalic.  Nose: Nose normal.  Mouth/Throat: Oropharynx is clear and moist. No oropharyngeal exudate.  Neck: Neck supple.  Cardiovascular: Normal rate and normal heart sounds.   No murmur heard. Pulmonary/Chest: Effort normal and breath sounds normal. She has no wheezes.  Lymphadenopathy:    She has no cervical adenopathy.  Skin: Skin is warm and dry.  Nursing note and vitals reviewed.         Assessment & Plan:  rhinosinusitis antibiotics prescribed warning signs discussed follow-up ongoing troubles  Severe constipation prescription medicine prescribed see above discussion Patient with anal fissure recommend topical  nifedipine 0.2% apply up to 4 times a day when necessary follow-up if any problems

## 2015-10-30 ENCOUNTER — Encounter: Payer: Self-pay | Admitting: Family Medicine

## 2015-10-30 ENCOUNTER — Ambulatory Visit (INDEPENDENT_AMBULATORY_CARE_PROVIDER_SITE_OTHER): Payer: BLUE CROSS/BLUE SHIELD | Admitting: Family Medicine

## 2015-10-30 VITALS — BP 130/86 | Temp 98.3°F | Ht 69.0 in | Wt 252.4 lb

## 2015-10-30 DIAGNOSIS — J019 Acute sinusitis, unspecified: Secondary | ICD-10-CM

## 2015-10-30 DIAGNOSIS — B9689 Other specified bacterial agents as the cause of diseases classified elsewhere: Secondary | ICD-10-CM

## 2015-10-30 MED ORDER — LEVOFLOXACIN 500 MG PO TABS: 500.0000 mg | ORAL_TABLET | Freq: Every day | ORAL | Status: DC

## 2015-10-30 NOTE — Progress Notes (Signed)
   Subjective:    Patient ID: Dana Downs, female    DOB: 03/24/1981, 35 y.o.   MRN: [id number]  Cough This is a new problem. The current episode started in the past 7 days. Associated symptoms include ear pain, headaches, rhinorrhea and a sore throat. Treatments tried: Nasal spray, otc meds.   Patient states no other concerns this visit.  Sore throat cough   Cough , wenr to wellness nurse two d later  Used saline nasal wash  Congested and cough green stuff   Frontal tend and sinus cong   Review of Systems  HENT: Positive for ear pain, rhinorrhea and sore throat.   Respiratory: Positive for cough.   Neurological: Positive for headaches.       Objective:   Physical Exam Alert, mild malaise. Hydration good Vitals stable. frontal/ maxillary tenderness evident positive nasal congestion. pharynx normal neck supple  lungs clear/no crackles or wheezes. heart regular in rhythm        Assessment & Plan:  Impression rhinosinusitis likely post viral, discussed with patient. plan antibiotics prescribed. Questions answered. Symptomatic care discussed. warning signs discussed. WSL

## 2016-05-30 ENCOUNTER — Ambulatory Visit (INDEPENDENT_AMBULATORY_CARE_PROVIDER_SITE_OTHER): Payer: BLUE CROSS/BLUE SHIELD | Admitting: Family Medicine

## 2016-05-30 ENCOUNTER — Encounter: Payer: Self-pay | Admitting: Family Medicine

## 2016-05-30 DIAGNOSIS — E041 Nontoxic single thyroid nodule: Secondary | ICD-10-CM | POA: Diagnosis not present

## 2016-05-30 NOTE — Progress Notes (Signed)
   Subjective:    Patient ID: Dana Downs, female    DOB: 1980/09/18, 35 y.o.   MRN: [id number]  HPI Patient in today for a thyroid nodule. Had wellness visit through work and wellness nurse felt thyroid nodule.   States no other concern this visit.  Patient has long-term constipation but no short-term symptoms pointing toward thyroid. She states she was having a routine checkup from her wellness nurse who discovered this and sent her here for further testing there is no family history of any thyroid disease cancer. No history of radiation.  Review of Systems See above    Objective:   Physical Exam  Lungs clear hearts regular in large thyroid noted on the right side. This is a new finding. Pulse normal BP good      Assessment & Plan:  Thyroid nodule-it is possible that this could be something more serious needs blood work needs ultrasound. May need fine-needle aspiration may need referral to ENT possible surgical removal await the results of all this test then go from there

## 2016-05-31 LAB — BASIC METABOLIC PANEL
BUN / CREAT RATIO: 14 (ref 9–23)
BUN: 10 mg/dL (ref 6–20)
CHLORIDE: 102 mmol/L (ref 96–106)
CO2: 23 mmol/L (ref 18–29)
Calcium: 9.3 mg/dL (ref 8.7–10.2)
Creatinine, Ser: 0.72 mg/dL (ref 0.57–1.00)
GFR calc Af Amer: 126 mL/min/{1.73_m2} (ref >59)
GFR calc non Af Amer: 110 mL/min/{1.73_m2} (ref >59)
GLUCOSE: 85 mg/dL (ref 65–99)
POTASSIUM: 4.1 mmol/L (ref 3.5–5.2)
Sodium: 141 mmol/L (ref 134–144)

## 2016-05-31 LAB — CBC WITH DIFFERENTIAL/PLATELET
Basophils Absolute: 0 10*3/uL (ref 0.0–0.2)
Basos: 0 %
EOS (ABSOLUTE): 0.2 10*3/uL (ref 0.0–0.4)
Eos: 3 %
HEMATOCRIT: 42.3 % (ref 34.0–46.6)
Hemoglobin: 14.7 g/dL (ref 11.1–15.9)
IMMATURE GRANULOCYTES: 0 %
Immature Grans (Abs): 0 10*3/uL (ref 0.0–0.1)
LYMPHS ABS: 2.6 10*3/uL (ref 0.7–3.1)
Lymphs: 36 %
MCH: 31.9 pg (ref 26.6–33.0)
MCHC: 34.8 g/dL (ref 31.5–35.7)
MCV: 92 fL (ref 79–97)
MONOS ABS: 0.4 10*3/uL (ref 0.1–0.9)
Monocytes: 6 %
NEUTROS PCT: 55 %
Neutrophils Absolute: 4 10*3/uL (ref 1.4–7.0)
PLATELETS: 292 10*3/uL (ref 150–379)
RBC: 4.61 x10E6/uL (ref 3.77–5.28)
RDW: 13.2 % (ref 12.3–15.4)
WBC: 7.2 10*3/uL (ref 3.4–10.8)

## 2016-05-31 LAB — LIPID PANEL
CHOLESTEROL TOTAL: 165 mg/dL (ref 100–199)
Chol/HDL Ratio: 4.6 ratio units — ABNORMAL HIGH (ref 0.0–4.4)
HDL: 36 mg/dL — ABNORMAL LOW (ref >39)
LDL CALC: 108 mg/dL — AB (ref 0–99)
TRIGLYCERIDES: 106 mg/dL (ref 0–149)
VLDL Cholesterol Cal: 21 mg/dL (ref 5–40)

## 2016-05-31 LAB — TSH: TSH: 0.926 u[IU]/mL (ref 0.450–4.500)

## 2016-05-31 LAB — T4, FREE: Free T4: 1.28 ng/dL (ref 0.82–1.77)

## 2016-06-05 ENCOUNTER — Ambulatory Visit (HOSPITAL_COMMUNITY)
Admission: RE | Admit: 2016-06-05 | Discharge: 2016-06-05 | Disposition: A | Discharge status: Home / Self Care | Payer: BLUE CROSS/BLUE SHIELD | Source: Ambulatory Visit | Attending: Family Medicine | Admitting: Family Medicine

## 2016-06-05 ENCOUNTER — Telehealth: Payer: Self-pay | Admitting: Family Medicine

## 2016-06-05 DIAGNOSIS — E041 Nontoxic single thyroid nodule: Secondary | ICD-10-CM | POA: Diagnosis present

## 2016-06-05 DIAGNOSIS — E042 Nontoxic multinodular goiter: Secondary | ICD-10-CM | POA: Insufficient documentation

## 2016-06-05 NOTE — Telephone Encounter (Signed)
Pt calling to see if we have her U/S results from this morning and to also let Dr. Nicki Reaper know that the nodule is significantly bigger, tender to the toush, painful with cough or swallow & all of this is new since pt was seen  Please advise

## 2016-06-05 NOTE — Telephone Encounter (Signed)
If infection issues, then ov on Thursday if it does not appear to be infected but tender we can check on Friday

## 2016-06-05 NOTE — Telephone Encounter (Signed)
Discussed with pt. Pt wants to come in tomorrow. Transferred to front to schedule visit tomorrow.

## 2016-06-06 ENCOUNTER — Ambulatory Visit (INDEPENDENT_AMBULATORY_CARE_PROVIDER_SITE_OTHER): Payer: BLUE CROSS/BLUE SHIELD | Admitting: Family Medicine

## 2016-06-06 ENCOUNTER — Encounter: Payer: Self-pay | Admitting: Family Medicine

## 2016-06-06 VITALS — BP 126/82 | Ht 69.0 in | Wt 242.2 lb

## 2016-06-06 DIAGNOSIS — E041 Nontoxic single thyroid nodule: Secondary | ICD-10-CM | POA: Diagnosis not present

## 2016-06-06 MED ORDER — AZITHROMYCIN 250 MG PO TABS: ORAL_TABLET | ORAL | 0 refills | Status: DC

## 2016-06-06 NOTE — Progress Notes (Signed)
   Subjective:    Patient ID: Dana Downs, female    DOB: 01-29-1981, 35 y.o.   MRN: [id number] Patient arrives office for very protracted discussion regarding her ongoing workup of her thyroid nodule, along with new symptomatology. HPI Patient arrives for a recheck on thyroid nodule. Patient ststes the nodule is getting bigger and painful. This was discovered incidentally. Patient did not know she had it until her mid-level provider work pointed out.  Patient now states it is substantially increased in size and is tender. Next  On further history others in the family have viral syndrome. Next  Patient also has congestion stuffiness and some mild systemic features along with some sore throat. Has also noted swollen glands.  Patient had ultrasound of her thyroid. This is reviewed at great length with patient today.  No family history of thyroid cancer.   Not much discomfort with the pappation  Day numb [phone]     Review of Systems No headache, no major weight loss or weight gain, no chest pain no back pain abdominal pain no change in bowel habits complete ROS otherwise negative     Objective:   Physical Exam Alert vitals stable, NAD. Blood pressure good on repeat. HEENTMild nasal congestion, palpable right thyroid nodule along with some cervical adenitis tenderness to palpation, otherwise normal. Lungs clear. Heart regular rate and rhythm.        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 Please see ultrasound results. Both are reassuring in their appearance, this is discussed at length with patient. Also characteristics of nodule on exam bed radiologist to state biopsy not absolutely indicated. However with overall size definitely feel ENT referral it is worth while. Advised patient that biopsy may or may not be pursued, versus ongoing surveillance. Antibiotic added for upper respiratory component. Multiple questions answered 25 minutes  spent most in discussion. We'll press on with ENT referral

## 2016-07-11 ENCOUNTER — Ambulatory Visit (INDEPENDENT_AMBULATORY_CARE_PROVIDER_SITE_OTHER): Payer: BLUE CROSS/BLUE SHIELD | Admitting: Otolaryngology

## 2016-07-11 DIAGNOSIS — D440 Neoplasm of uncertain behavior of thyroid gland: Secondary | ICD-10-CM

## 2016-07-11 DIAGNOSIS — R070 Pain in throat: Secondary | ICD-10-CM | POA: Diagnosis not present

## 2016-07-11 DIAGNOSIS — R1312 Dysphagia, oropharyngeal phase: Secondary | ICD-10-CM

## 2016-08-19 ENCOUNTER — Other Ambulatory Visit: Payer: Self-pay | Admitting: Otolaryngology

## 2016-08-23 ENCOUNTER — Telehealth: Payer: Self-pay | Admitting: Family Medicine

## 2016-08-23 DIAGNOSIS — E041 Nontoxic single thyroid nodule: Secondary | ICD-10-CM

## 2016-08-23 NOTE — Telephone Encounter (Signed)
I recommend Dr. Redmond Baseman with [facility]

## 2016-08-23 NOTE — Telephone Encounter (Signed)
Pt wants referral to another ENT States she wasn't satisfied with Dr. Benjamine Mola, he wants her to have surgery & she feels it's all confusing Would like a 2nd opinion  Please advise

## 2016-08-26 ENCOUNTER — Encounter: Payer: Self-pay | Admitting: Family Medicine

## 2016-09-10 ENCOUNTER — Ambulatory Visit (HOSPITAL_BASED_OUTPATIENT_CLINIC_OR_DEPARTMENT_OTHER): Admit: 2016-09-10 | Payer: BLUE CROSS/BLUE SHIELD | Admitting: Otolaryngology

## 2016-09-10 ENCOUNTER — Encounter (HOSPITAL_BASED_OUTPATIENT_CLINIC_OR_DEPARTMENT_OTHER): Payer: Self-pay

## 2016-09-10 SURGERY — THYROIDECTOMY
Anesthesia: General | Laterality: Right

## 2016-11-08 ENCOUNTER — Ambulatory Visit (INDEPENDENT_AMBULATORY_CARE_PROVIDER_SITE_OTHER): Payer: BLUE CROSS/BLUE SHIELD | Admitting: Family Medicine

## 2016-11-08 ENCOUNTER — Encounter: Payer: Self-pay | Admitting: Family Medicine

## 2016-11-08 VITALS — Temp 98.5°F | Ht 69.0 in | Wt 232.6 lb

## 2016-11-08 DIAGNOSIS — J4520 Mild intermittent asthma, uncomplicated: Secondary | ICD-10-CM | POA: Diagnosis not present

## 2016-11-08 DIAGNOSIS — B9689 Other specified bacterial agents as the cause of diseases classified elsewhere: Secondary | ICD-10-CM | POA: Diagnosis not present

## 2016-11-08 DIAGNOSIS — J019 Acute sinusitis, unspecified: Secondary | ICD-10-CM | POA: Diagnosis not present

## 2016-11-08 MED ORDER — AZITHROMYCIN 250 MG PO TABS: ORAL_TABLET | ORAL | 0 refills | Status: DC

## 2016-11-08 MED ORDER — ALBUTEROL SULFATE HFA 108 (90 BASE) MCG/ACT IN AERS: 2.0000 | INHALATION_SPRAY | Freq: Four times a day (QID) | RESPIRATORY_TRACT | 2 refills | Status: DC | PRN

## 2016-11-08 MED ORDER — HYDROCODONE-HOMATROPINE 5-1.5 MG/5ML PO SYRP: 5.0000 mL | ORAL_SOLUTION | Freq: Every evening | ORAL | 0 refills | Status: DC | PRN

## 2016-11-08 NOTE — Progress Notes (Signed)
   Subjective:    Patient ID: Dana Downs, female    DOB: Jul 04, 1981, 36 y.o.   MRN: [id number]  Cough  This is a new problem. The current episode started 1 to 4 weeks ago. Associated symptoms include ear pain, headaches, nasal congestion, a sore throat and wheezing. She has tried OTC cough suppressant for the symptoms.   Started ten d ago, with a bad sore throat , got really hoarse, took alk cold and flyu, had headache nd ear pain and cough  Cough very bad  internittent prod cough,  Up at night   No snijer  did get flu shot    Review of Systems  HENT: Positive for ear pain and sore throat.   Respiratory: Positive for cough and wheezing.   Neurological: Positive for headaches.       Objective:   Physical Exam Alert, mild malaise. Hydration good Vitals stable. frontal/ maxillary tenderness evident positive nasal congestion. pharynx normal neck supple  lungs clear/no crackles or wheezes., However coughs are associated with audible wheezing heart regular in rhythm        Assessment & Plan:  Impression rhinosinusitis/Bronchitis likely post viral, discussed with patient. plan antibiotics prescribed. Also albuterol when necessary for wheezing. Nighttime Hycodan. Questions answered. Symptomatic care discussed. warning signs discussed. WSL

## 2017-01-08 IMAGING — US US THYROID
1 series · 13 of 25 positions shown · non-contrast
Comparison: [DATE]

CLINICAL DATA: Thyroid nodules.

EXAM:
THYROID ULTRASOUND
TECHNIQUE: Ultrasound examination of the thyroid gland and adjacent soft
tissues was performed.

[Series 1: us thyroid · 0.07mm/px · 13 of 81 slices shown]
[im 1/81]
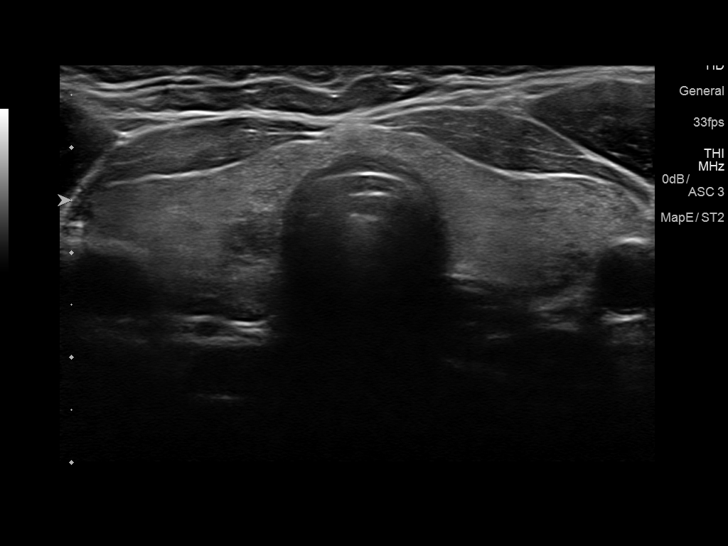
[im 7/81]
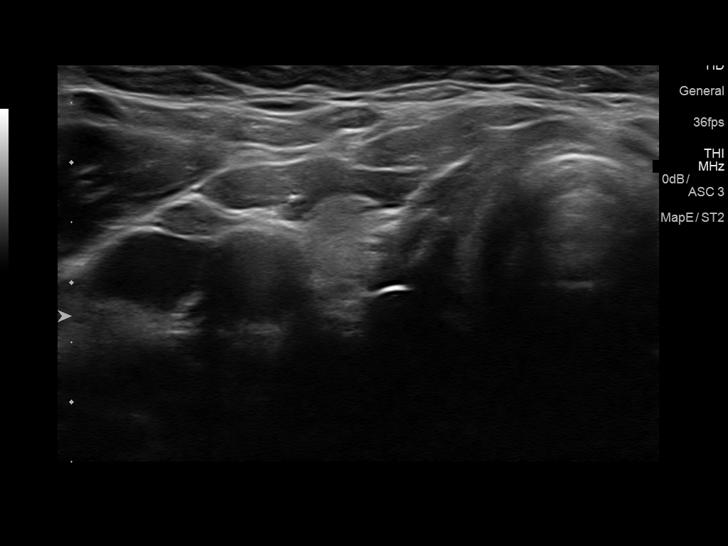
[im 14/81]
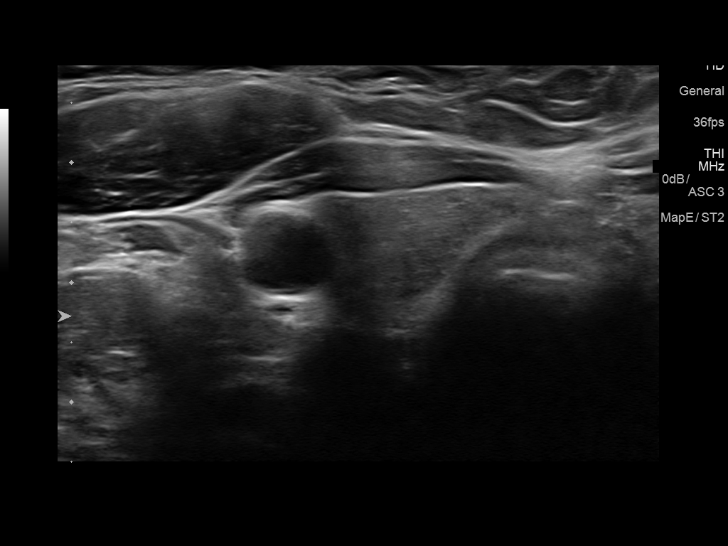
[im 21/81]
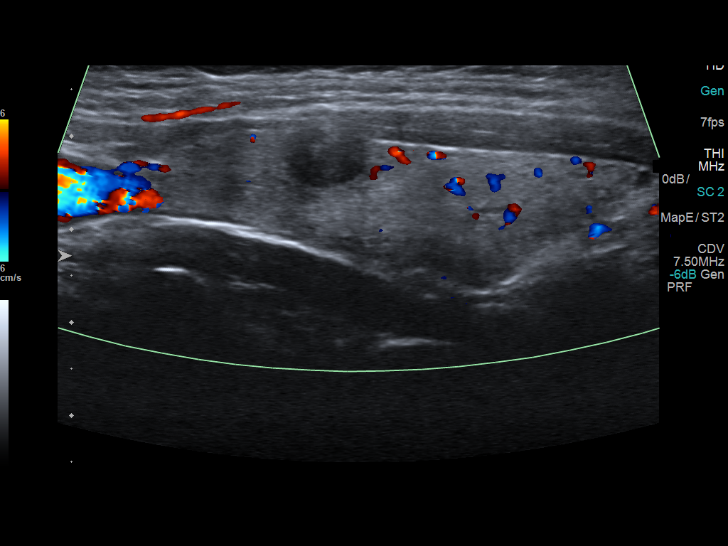
[im 27/81]
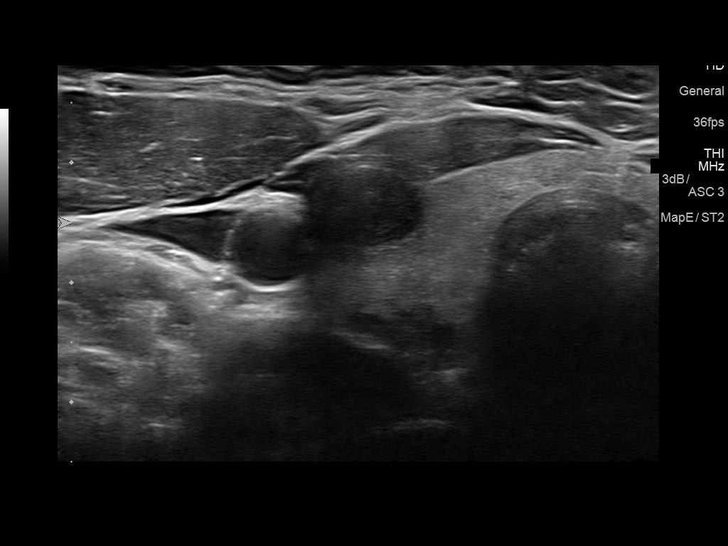
[im 34/81]
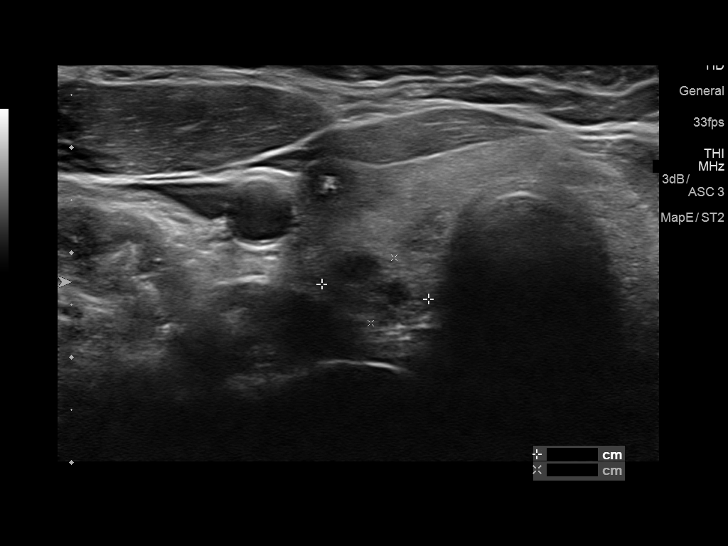
[im 41/81]
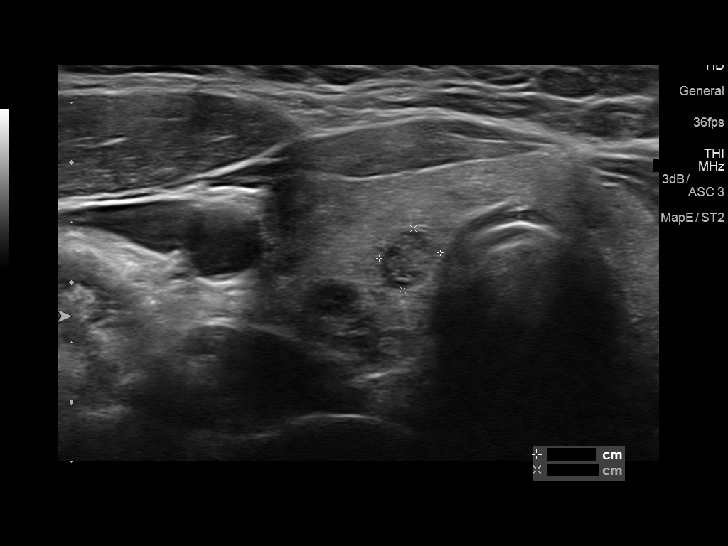
[im 47/81]
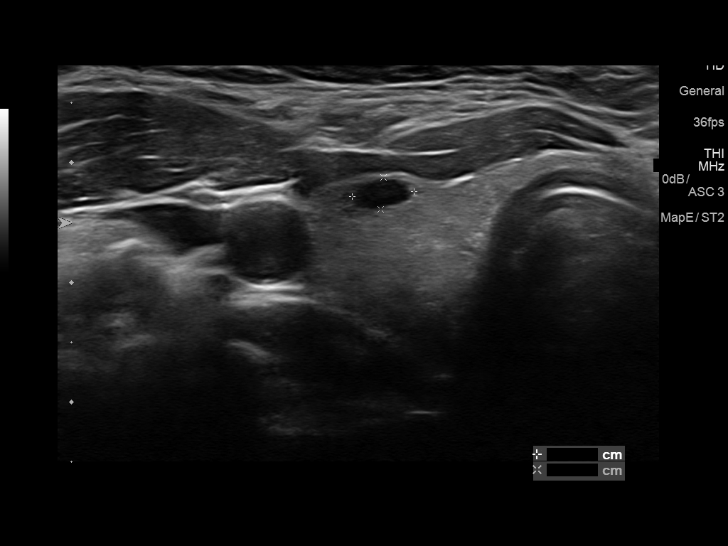
[im 54/81]
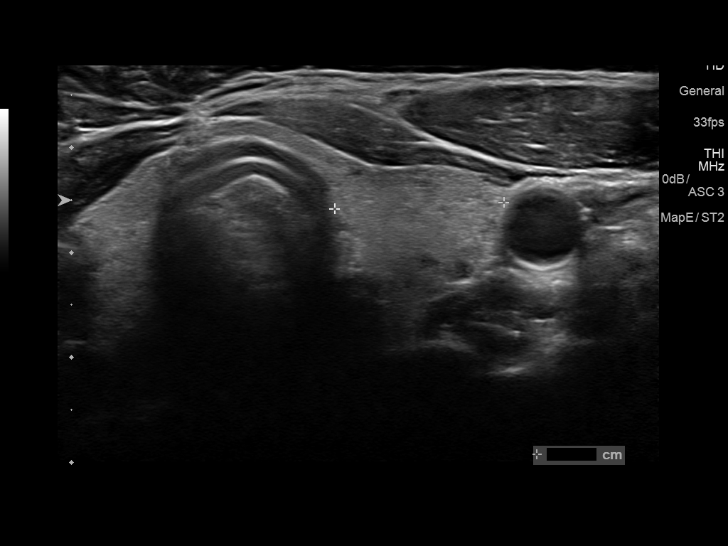
[im 61/81]
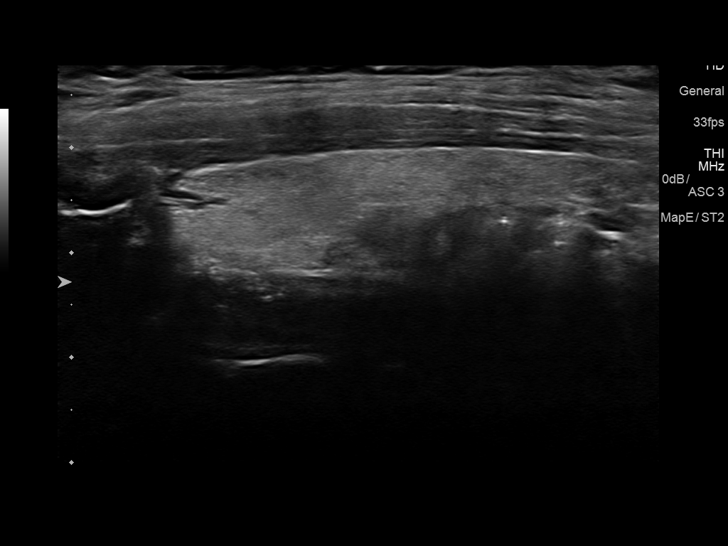
[im 67/81]
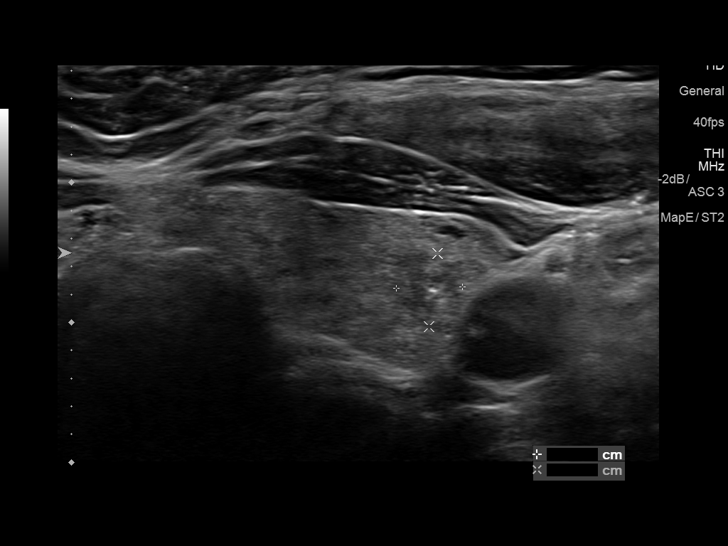
[im 74/81]
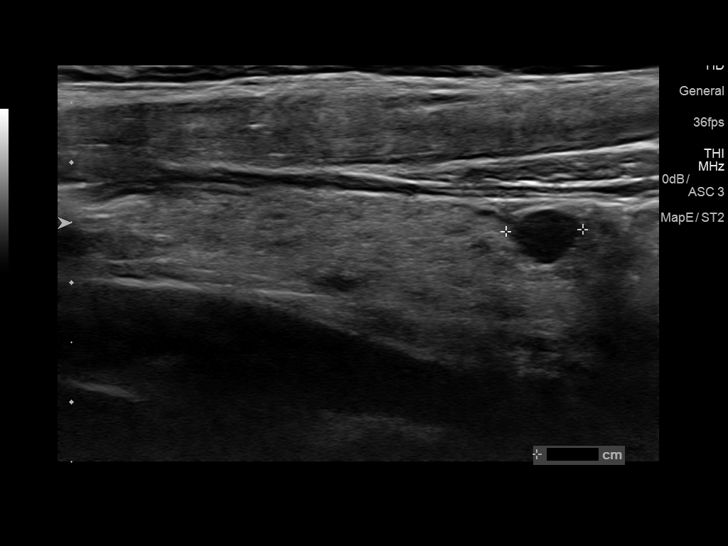
[im 81/81]
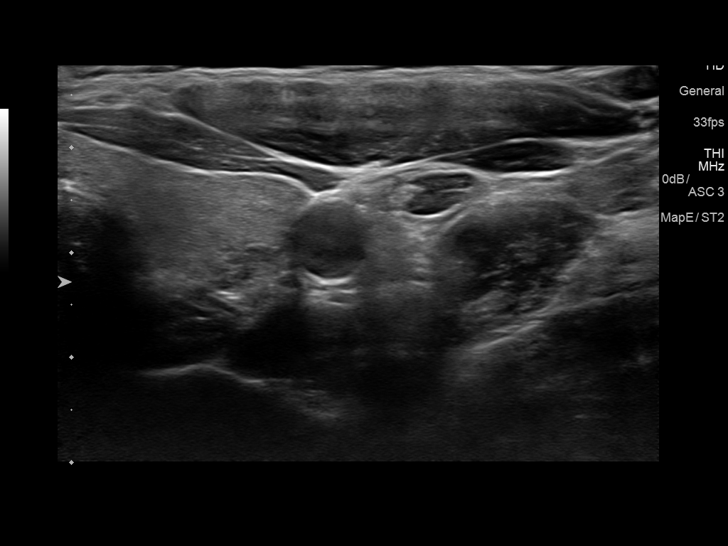

[13 of 25 positions shown; findings below may reference images not displayed]

FINDINGS: Parenchymal Echotexture: Moderately heterogenous

Isthmus: 0.3 cm, previously 0.8 cm

Right lobe: 6.2 x 1.6 x 2.1 cm, previously 5.6 x 3.6 x 3.9 cm

Left lobe: 5.7 x 1.4 x 1.6 cm, previously 5.6 x 1.5 x 1.7 cm

_________________________________________________________

Estimated total number of nodules >/= 1 cm: 2

Number of spongiform nodules >/=  2 cm not described below (TR1): 0

Number of mixed cystic and solid nodules >/= 1.5 cm not described
below (TR2): 0

_________________________________________________________

The predominantly cystic nodule in the mid right thyroid lobe has
markedly decreased in size. This cyst now measures 1.0 x 0.7 x
cm and previously measured 3.6 x 2.5 x 3.1 cm. No vascular flow
within this cystic nodule.

There are numerous subcentimeter nodules and cysts in the right
thyroid lobe.

Nodule # 1:

Location: Right; Inferior

Maximum size: 1.3 cm; Other 2 dimensions: 0.7 x 1.0 cm

Composition: cannot determine (2)

Echogenicity: hypoechoic (2)

Shape: not taller-than-wide (0)

Margins: ill-defined (0)

Echogenic foci: none (0)

ACR TI-RADS total points: 4.

ACR TI-RADS risk category: TR4 (4-6 points).

ACR TI-RADS recommendations:

*Given size (>/= 1 - 1.4 cm) and appearance, a follow-up ultrasound
in 1 year should be considered based on TI-RADS criteria.

_________________________________________________________

Nodule # 2:

Location: Left; Inferior

Maximum size: 0.5 cm; Other 2 dimensions: 0.5 x 0.5 cm

Composition: solid/almost completely solid (2)

Echogenicity: hypoechoic (2)

Shape: not taller-than-wide (0)

Margins: ill-defined (0)

Echogenic foci: macrocalcifications (1)

ACR TI-RADS total points: 5.

ACR TI-RADS risk category: TR4 (4-6 points).

ACR TI-RADS recommendations:

Given size (<0.9 cm) and appearance, this nodule does NOT meet
TI-RADS criteria for biopsy or dedicated follow-up.

_________________________________________________________

Few scattered cysts in the left thyroid lobe.
IMPRESSION: Heterogeneous thyroid with multiple small nodules and cysts.

The dominant cystic nodule in the right thyroid lobe has markedly
decreased in size.

Nodule #1 in the posterior inferior right thyroid lobe meets
criteria for 1 year follow-up.

The above is in keeping with the ACR TI-RADS recommendations - [HOSPITAL] [WH];[DATE].

## 2017-07-17 ENCOUNTER — Other Ambulatory Visit (INDEPENDENT_AMBULATORY_CARE_PROVIDER_SITE_OTHER): Payer: Self-pay | Admitting: Otolaryngology

## 2017-07-17 ENCOUNTER — Ambulatory Visit (INDEPENDENT_AMBULATORY_CARE_PROVIDER_SITE_OTHER): Payer: BLUE CROSS/BLUE SHIELD | Admitting: Otolaryngology

## 2017-07-17 DIAGNOSIS — E041 Nontoxic single thyroid nodule: Secondary | ICD-10-CM

## 2017-08-01 ENCOUNTER — Ambulatory Visit (INDEPENDENT_AMBULATORY_CARE_PROVIDER_SITE_OTHER): Payer: Commercial Managed Care - PPO | Admitting: Family Medicine

## 2017-08-01 ENCOUNTER — Encounter: Payer: Self-pay | Admitting: Family Medicine

## 2017-08-01 VITALS — Temp 98.4°F | Ht 69.0 in | Wt 232.0 lb

## 2017-08-01 DIAGNOSIS — J020 Streptococcal pharyngitis: Secondary | ICD-10-CM | POA: Diagnosis not present

## 2017-08-01 LAB — POCT RAPID STREP A (OFFICE): Rapid Strep A Screen: POSITIVE — AB

## 2017-08-01 MED ORDER — AZITHROMYCIN 250 MG PO TABS: ORAL_TABLET | ORAL | 0 refills | Status: DC

## 2017-08-01 NOTE — Patient Instructions (Signed)

## 2017-08-01 NOTE — Progress Notes (Signed)
   Subjective:    Patient ID: Dana Downs, female    DOB: 09-02-1980, 36 y.o.   MRN: [id number]  Sinus Problem  This is a new problem. The current episode started in the past 7 days. Associated symptoms include congestion, coughing, ear pain, headaches, sinus pressure and a sore throat. Pertinent negatives include no shortness of breath. Treatments tried: tylenol severe cold.   Patient relates soreness pain discomfort with swallowing denies high fever chills sweats   Review of Systems  Constitutional: Negative for activity change and fever.  HENT: Positive for congestion, ear pain, rhinorrhea, sinus pressure and sore throat.   Eyes: Negative for discharge.  Respiratory: Positive for cough. Negative for shortness of breath and wheezing.   Cardiovascular: Negative for chest pain.  Neurological: Positive for headaches.       Objective:   Physical Exam  Constitutional: She appears well-developed.  HENT:  Head: Normocephalic.  Right Ear: External ear normal.  Left Ear: External ear normal.  Nose: Nose normal.  Mouth/Throat: Oropharynx is clear and moist. No oropharyngeal exudate.  Eyes: Right eye exhibits no discharge. Left eye exhibits no discharge.  Neck: Neck supple. No tracheal deviation present.  Cardiovascular: Normal rate and normal heart sounds.  No murmur heard. Pulmonary/Chest: Effort normal and breath sounds normal. She has no wheezes. She has no rales.  Lymphadenopathy:    She has no cervical adenopathy.  Skin: Skin is warm and dry.  Nursing note and vitals reviewed.  Tenderness along the left side of the neck with lymphadenopathy tonsils bilateral are enlarged but the left one is red with exudate no sign of abscess currently       Assessment & Plan:  Strep Zithromax Salt water gargles Ibuprofen or Tylenol Warning signs regarding progressive strep hoarse or peritonsillar abscess was given Follow-up if progressive troubles go to ER if any signs of abscess

## 2017-09-17 ENCOUNTER — Ambulatory Visit
Admission: RE | Admit: 2017-09-17 | Discharge: 2017-09-17 | Disposition: A | Discharge status: Home / Self Care | Payer: Commercial Managed Care - PPO | Source: Ambulatory Visit | Attending: Otolaryngology | Admitting: Otolaryngology

## 2017-09-17 DIAGNOSIS — E041 Nontoxic single thyroid nodule: Secondary | ICD-10-CM

## 2017-09-25 ENCOUNTER — Telehealth: Payer: Self-pay | Admitting: Family Medicine

## 2017-09-25 ENCOUNTER — Other Ambulatory Visit: Payer: Self-pay | Admitting: *Deleted

## 2017-09-25 MED ORDER — LINACLOTIDE 145 MCG PO CAPS: 145.0000 ug | ORAL_CAPSULE | Freq: Every day | ORAL | 4 refills | Status: DC

## 2017-09-25 NOTE — Telephone Encounter (Signed)
Having a lot of bloating, adb pain, and pelvic pain. Went to gyn and had [place]. US showed that she was constipated. They recommend to take a laxative. Pt states she has had this issue for her whole life and laxatives never worked. Pt states last year dr Nicki Reaper wrote a rx for linzess. Took in the past and it worked well. Had to stop because the cost went up. [facility]es [facility] [place]. [place] was done today in their office. Pt did not think to ask until after she left. Pt states she is ok to come in if dr scott wants her too.

## 2017-09-25 NOTE — Telephone Encounter (Signed)
Appointment scheduled.

## 2017-09-25 NOTE — Telephone Encounter (Signed)
Against prescribing Linzess 145 1 daily as directed #30 with 4 refills if this is not covered Amitiza can be helpful.  It works very similar.  I do recommend for the patient to do a follow-up visit somewhere within the next 3 weeks if she is having significant pain discomfort or other worrisome signs to be happy to see her sooner

## 2017-09-25 NOTE — Telephone Encounter (Signed)
Discussed with pt. Pt verbalized understanding. Med sent to pharm. I went to transfer pt to front to schedule office visit in 3 weeks with dr Nicki Reaper and hung up on pt by mistake. Message routed to front staff to call pt and schedule office visit with dr Nicki Reaper in 3 weeks for constipation.

## 2017-09-25 NOTE — Telephone Encounter (Signed)
Wants to talk to the nurse regarding information she just got from her OB/Gyn.

## 2017-09-25 NOTE — Telephone Encounter (Signed)
Pt will have her gyn send over the [place] report.   [facility].

## 2017-09-26 ENCOUNTER — Telehealth: Payer: Self-pay | Admitting: Family Medicine

## 2017-09-26 NOTE — Telephone Encounter (Signed)
Review ultrasound results in results folder faxed from [facility].

## 2017-09-28 NOTE — Telephone Encounter (Signed)
Under care of OB GYN, result noted

## 2017-10-16 ENCOUNTER — Ambulatory Visit: Payer: Commercial Managed Care - PPO | Admitting: Family Medicine

## 2017-10-30 ENCOUNTER — Ambulatory Visit (INDEPENDENT_AMBULATORY_CARE_PROVIDER_SITE_OTHER): Payer: Commercial Managed Care - PPO | Admitting: Family Medicine

## 2017-10-30 ENCOUNTER — Encounter: Payer: Self-pay | Admitting: Family Medicine

## 2017-10-30 VITALS — BP 118/84 | Temp 98.6°F | Ht 69.0 in | Wt 226.0 lb

## 2017-10-30 DIAGNOSIS — B9689 Other specified bacterial agents as the cause of diseases classified elsewhere: Secondary | ICD-10-CM | POA: Diagnosis not present

## 2017-10-30 DIAGNOSIS — K5900 Constipation, unspecified: Secondary | ICD-10-CM | POA: Diagnosis not present

## 2017-10-30 DIAGNOSIS — J019 Acute sinusitis, unspecified: Secondary | ICD-10-CM | POA: Diagnosis not present

## 2017-10-30 MED ORDER — LEVOFLOXACIN 500 MG PO TABS: 500.0000 mg | ORAL_TABLET | Freq: Every day | ORAL | 0 refills | Status: DC

## 2017-10-30 MED ORDER — LINACLOTIDE 145 MCG PO CAPS: 145.0000 ug | ORAL_CAPSULE | Freq: Every day | ORAL | 4 refills | Status: DC

## 2017-10-30 NOTE — Progress Notes (Signed)
   Subjective:    Patient ID: Dana Downs, female    DOB: 05/01/1981, 37 y.o.   MRN: [id number]  HPIfollow up on constipation. Never picked up linzess because of the cost.  Patient has chronic constipation issues with slow transit she is seen gastroenterology before.  They recommended Amitiza but that was too expensive so therefore we tried Linzess this certainly helps but the cost of it is prohibitive it is over $200 a month for the medicine which she states she cannot afford she is requesting lower cost options She denies any rectal bleeding issues  Cough, congestion, headache. Started one week ago.  Patient relates head congestion drainage coughing bringing up mucus denies high fever chills denies wheezing denies nausea vomiting diarrhea   Review of Systems  Constitutional: Negative for activity change and fever.  HENT: Positive for congestion and rhinorrhea. Negative for ear pain.   Eyes: Negative for discharge.  Respiratory: Positive for cough. Negative for shortness of breath and wheezing.   Cardiovascular: Negative for chest pain.       Objective:   Physical Exam  Constitutional: She appears well-developed.  HENT:  Head: Normocephalic.  Right Ear: External ear normal.  Left Ear: External ear normal.  Nose: Nose normal.  Mouth/Throat: Oropharynx is clear and moist. No oropharyngeal exudate.  Eyes: Right eye exhibits no discharge. Left eye exhibits no discharge.  Neck: Neck supple. No tracheal deviation present.  Cardiovascular: Normal rate and normal heart sounds.  No murmur heard. Pulmonary/Chest: Effort normal and breath sounds normal. She has no wheezes. She has no rales.  Lymphadenopathy:    She has no cervical adenopathy.  Skin: Skin is warm and dry.  Nursing note and vitals reviewed.         Assessment & Plan:  Patient was seen today for upper respiratory illness. It is felt that the patient is dealing with sinusitis. Antibiotics were prescribed today.  Importance of compliance with medication was discussed. Symptoms should gradually resolve over the course of the next several days. If high fevers, progressive illness, difficulty breathing, worsening condition or failure for symptoms to improve over the next several days then the patient is to follow-up. If any emergent conditions the patient is to follow-up in the emergency department otherwise to follow-up in the office.  Idiopathic constipation issues-slow transit time- try MiraLAX to try stool softeners water and fiber.  If this does not help enough she will need to use Linzess and unfortunately pay the high price  If the patient's condition progressively gets worse she states she will let us know and we will set her up with gastroenterology she will do stool test for blood she will also get recent labs from her gynecologist sent to [place]

## 2018-04-24 ENCOUNTER — Telehealth: Payer: Self-pay | Admitting: Family Medicine

## 2018-04-24 MED ORDER — MECLIZINE HCL 25 MG PO TABS: 25.0000 mg | ORAL_TABLET | Freq: Three times a day (TID) | ORAL | 0 refills | Status: DC | PRN

## 2018-04-24 NOTE — Telephone Encounter (Signed)
Pt calling in for an appt for dizziness. She states it started last night and it comes and goes. When it comes it last a while. It is worse when she lays down and it is making her nauseous. I asked her if it is happening upon standing or sitting and she said no it just comes out of no where. CB# [phone].

## 2018-04-24 NOTE — Telephone Encounter (Signed)
ntsw please take a few moments to clarify symtoms to make sure overall ok and not in need o f urgent visit elsewhere, antivert 25 tid prn dizziness, if persists into next wk rec o v then

## 2018-04-24 NOTE — Telephone Encounter (Addendum)
Patient reports dizziness started last night and it comes and goes. When it comes it last a while. It is worse when she lays down and it is making her nauseous.  Dr Richardson Landry recommends antivert 25 tid prn dizziness, if persists into next week recommend office visit. Patient verbalized understanding. Prescription sent electronically to pharmacy.

## 2018-05-07 ENCOUNTER — Ambulatory Visit (INDEPENDENT_AMBULATORY_CARE_PROVIDER_SITE_OTHER): Payer: Commercial Managed Care - PPO | Admitting: Family Medicine

## 2018-05-07 ENCOUNTER — Encounter: Payer: Self-pay | Admitting: Family Medicine

## 2018-05-07 VITALS — BP 122/82 | Ht 69.0 in | Wt 242.0 lb

## 2018-05-07 DIAGNOSIS — R42 Dizziness and giddiness: Secondary | ICD-10-CM

## 2018-05-07 NOTE — Progress Notes (Signed)
   Subjective:    Patient ID: Dana Downs, female    DOB: 20-Jan-1981, 37 y.o.   MRN: [id number]  Dizziness  Associated symptoms include nausea. Pertinent negatives include no congestion, numbness or weakness.  Patient arrives with on going off and on dizziness. Patient is unable to take medication prescribed(Anivert)  during the day because it causes drowsiness  Reports hx of vertigo as a teenager, has not had an issue since until the last 2 weeks. Has had intermittent dizziness throughout the day lasting about a couple hours, reports worse when moving head in certain directions, able to walk but feels very unsteady like she's leaning, reports some nausea when it gets bad. Reports spinning sensation. Triggered more by the sun, riding in the car, when she first gets out of bed. Has tried dramamine and meclizine, but they make her drowsy during the day so unable to take.  Reports mom has hx of MS and one of her presenting symptoms was dizziness, she had MRI done as a teenager and was negative.  Also reports intermittent h/a to left frontal area by eye, described as stabbing, reports occasional nausea associated, takes aleve and relieves. Reports doesn't seem to be r/t dizziness.  Review of Systems  HENT: Negative for congestion, ear discharge and ear pain.   Gastrointestinal: Positive for nausea.  Neurological: Positive for dizziness. Negative for weakness and numbness.       Objective:   Physical Exam  Constitutional: She is oriented to person, place, and time. She appears well-developed and well-nourished. No distress.  HENT:  Head: Normocephalic and atraumatic.  Right Ear: Tympanic membrane and ear canal normal.  Left Ear: Tympanic membrane and ear canal normal.  Nose: Nose normal.  Mouth/Throat: Oropharynx is clear and moist.  Eyes: Pupils are equal, round, and reactive to light. Right eye exhibits no nystagmus. Left eye exhibits no nystagmus.  Neck: Neck supple.  Cardiovascular:  Normal rate, regular rhythm and normal heart sounds.  Pulmonary/Chest: Effort normal and breath sounds normal. No respiratory distress.  Neurological: She is alert and oriented to person, place, and time. Coordination normal.  Skin: Skin is warm and dry.  Psychiatric: She has a normal mood and affect.  Nursing note and vitals reviewed.      Assessment & Plan:  1. Vertigo Likely benign, no element of central etiology noted on exam. Instructions given to perform Epley maneuver several times a day with symptoms.  If symptoms persist or worsen over the next couple weeks let us know and we will place ENT referral at that time. Pt agreeable with this plan.  Epley maneuver was shown to the patient in addition to this discussion was held with the patient I do not find evidence of any type of central aspects such as tumor or stroke I recommend if progressive troubles or problems to notify us and we will help set her up with ENT   As attending physician to this patient visit, this patient was seen in conjunction with the nurse practitioner.  The history,physical and treatment plan was reviewed with the nurse practitioner and pertinent findings were verified with the patient.  Also the treatment plan was reviewed with the patient while they were present. SAL

## 2018-09-10 ENCOUNTER — Encounter: Payer: Self-pay | Admitting: Family Medicine

## 2018-09-10 ENCOUNTER — Ambulatory Visit (INDEPENDENT_AMBULATORY_CARE_PROVIDER_SITE_OTHER): Payer: Commercial Managed Care - PPO | Admitting: Family Medicine

## 2018-09-10 VITALS — BP 138/90 | Temp 98.1°F | Ht 69.0 in | Wt 235.0 lb

## 2018-09-10 DIAGNOSIS — J019 Acute sinusitis, unspecified: Secondary | ICD-10-CM

## 2018-09-10 MED ORDER — AZITHROMYCIN 250 MG PO TABS: ORAL_TABLET | ORAL | 0 refills | Status: DC

## 2018-09-10 NOTE — Progress Notes (Signed)
   Subjective:    Patient ID: Dana Downs, female    DOB: 14-Aug-1980, 38 y.o.   MRN: [id number]  Sinusitis  This is a new problem. Episode onset: 2 days. Associated symptoms include congestion, coughing, ear pain, headaches, sinus pressure and a sore throat. Pertinent negatives include no chills or shortness of breath. Treatments tried: otc sinus meds.   No fever, no chills, or body aches. Appetite normal. More tired than usual. Slight cough.   Has tried tylenol severe sinus- helps some.    Review of Systems  Constitutional: Negative for appetite change, chills and fever.  HENT: Positive for congestion, ear pain, sinus pressure and sore throat.   Respiratory: Positive for cough. Negative for shortness of breath and wheezing.   Gastrointestinal: Negative for diarrhea, nausea and vomiting.  Neurological: Positive for headaches.       Objective:   Physical Exam Vitals signs and nursing note reviewed.  Constitutional:      General: She is not in acute distress.    Appearance: Normal appearance. She is not toxic-appearing.  HENT:     Head: Normocephalic and atraumatic.     Right Ear: Tympanic membrane normal.     Left Ear: Tympanic membrane normal.     Nose: Congestion present.     Mouth/Throat:     Mouth: Mucous membranes are moist.     Pharynx: Oropharynx is clear.  Eyes:     General:        Right eye: No discharge.        Left eye: No discharge.  Neck:     Musculoskeletal: Neck supple. No neck rigidity.  Cardiovascular:     Rate and Rhythm: Normal rate and regular rhythm.     Heart sounds: Normal heart sounds.  Pulmonary:     Effort: Pulmonary effort is normal. No respiratory distress.     Breath sounds: Normal breath sounds. No wheezing or rales.  Lymphadenopathy:     Cervical: No cervical adenopathy.  Skin:    General: Skin is warm and dry.  Neurological:     Mental Status: She is alert and oriented to person, place, and time.           Assessment & Plan:   Acute rhinosinusitis  Discussed with patient still likely viral etiology at this point.  Symptomatic care discussed.  Printed prescription for antibiotic given to patient with instructions to wait and only fill if symptoms are worsening over the next several days.  If noticing significant worsening in symptoms or high fever she should follow-up or go to urgent care or the ED over the weekend.  Warning signs discussed.  Follow-up if symptoms worsen or fail to improve

## 2018-09-10 NOTE — Patient Instructions (Signed)
Sinusitis, Adult  Sinusitis is inflammation of your sinuses. Sinuses are hollow spaces in the bones around your face. Your sinuses are located:   Around your eyes.   In the middle of your forehead.   Behind your nose.   In your cheekbones.  Mucus normally drains out of your sinuses. When your nasal tissues become inflamed or swollen, mucus can become trapped or blocked. This allows bacteria, viruses, and fungi to grow, which leads to infection. Most infections of the sinuses are caused by a virus.  Sinusitis can develop quickly. It can last for up to 4 weeks (acute) or for more than 12 weeks (chronic). Sinusitis often develops after a cold.  What are the causes?  This condition is caused by anything that creates swelling in the sinuses or stops mucus from draining. This includes:   Allergies.   Asthma.   Infection from bacteria or viruses.   Deformities or blockages in your nose or sinuses.   Abnormal growths in the nose (nasal polyps).   Pollutants, such as chemicals or irritants in the air.   Infection from fungi (rare).  What increases the risk?  You are more likely to develop this condition if you:   Have a weak body defense system (immune system).   Do a lot of swimming or diving.   Overuse nasal sprays.   Smoke.  What are the signs or symptoms?  The main symptoms of this condition are pain and a feeling of pressure around the affected sinuses. Other symptoms include:   Stuffy nose or congestion.   Thick drainage from your nose.   Swelling and warmth over the affected sinuses.   Headache.   Upper toothache.   A cough that may get worse at night.   Extra mucus that collects in the throat or the back of the nose (postnasal drip).   Decreased sense of smell and taste.   Fatigue.   A fever.   Sore throat.   Bad breath.  How is this diagnosed?  This condition is diagnosed based on:   Your symptoms.   Your medical history.   A physical exam.   Tests to find out if your condition is  acute or chronic. This may include:  ? Checking your nose for nasal polyps.  ? Viewing your sinuses using a device that has a light (endoscope).  ? Testing for allergies or bacteria.  ? Imaging tests, such as an MRI or CT scan.  In rare cases, a bone biopsy may be done to rule out more serious types of fungal sinus disease.  How is this treated?  Treatment for sinusitis depends on the cause and whether your condition is chronic or acute.   If caused by a virus, your symptoms should go away on their own within 10 days. You may be given medicines to relieve symptoms. They include:  ? Medicines that shrink swollen nasal passages (topical intranasal decongestants).  ? Medicines that treat allergies (antihistamines).  ? A spray that eases inflammation of the nostrils (topical intranasal corticosteroids).  ? Rinses that help get rid of thick mucus in your nose (nasal saline washes).   If caused by bacteria, your health care provider may recommend waiting to see if your symptoms improve. Most bacterial infections will get better without antibiotic medicine. You may be given antibiotics if you have:  ? A severe infection.  ? A weak immune system.   If caused by narrow nasal passages or nasal polyps, you may need   to have surgery.  Follow these instructions at home:  Medicines   Take, use, or apply over-the-counter and prescription medicines only as told by your health care provider. These may include nasal sprays.   If you were prescribed an antibiotic medicine, take it as told by your health care provider. Do not stop taking the antibiotic even if you start to feel better.  Hydrate and humidify     Drink enough fluid to keep your urine pale yellow. Staying hydrated will help to thin your mucus.   Use a cool mist humidifier to keep the humidity level in your home above 50%.   Inhale steam for 10-15 minutes, 3-4 times a day, or as told by your health care provider. You can do this in the bathroom while a hot shower is  running.   Limit your exposure to cool or dry air.  Rest   Rest as much as possible.   Sleep with your head raised (elevated).   Make sure you get enough sleep each night.  General instructions     Apply a warm, moist washcloth to your face 3-4 times a day or as told by your health care provider. This will help with discomfort.   Wash your hands often with soap and water to reduce your exposure to germs. If soap and water are not available, use hand sanitizer.   Do not smoke. Avoid being around people who are smoking (secondhand smoke).   Keep all follow-up visits as told by your health care provider. This is important.  Contact a health care provider if:   You have a fever.   Your symptoms get worse.   Your symptoms do not improve within 10 days.  Get help right away if:   You have a severe headache.   You have persistent vomiting.   You have severe pain or swelling around your face or eyes.   You have vision problems.   You develop confusion.   Your neck is stiff.   You have trouble breathing.  Summary   Sinusitis is soreness and inflammation of your sinuses. Sinuses are hollow spaces in the bones around your face.   This condition is caused by nasal tissues that become inflamed or swollen. The swelling traps or blocks the flow of mucus. This allows bacteria, viruses, and fungi to grow, which leads to infection.   If you were prescribed an antibiotic medicine, take it as told by your health care provider. Do not stop taking the antibiotic even if you start to feel better.   Keep all follow-up visits as told by your health care provider. This is important.  This information is not intended to replace advice given to you by your health care provider. Make sure you discuss any questions you have with your health care provider.  Document Released: 07/22/2005 Document Revised: 12/22/2017 Document Reviewed: 12/22/2017  Elsevier Interactive Patient Education  2019 [employer]

## 2019-01-27 ENCOUNTER — Other Ambulatory Visit: Payer: Commercial Managed Care - PPO

## 2019-01-27 ENCOUNTER — Other Ambulatory Visit: Payer: Self-pay | Admitting: Internal Medicine

## 2019-01-27 DIAGNOSIS — Z20822 Contact with and (suspected) exposure to covid-19: Secondary | ICD-10-CM

## 2019-02-01 LAB — NOVEL CORONAVIRUS, NAA: SARS-CoV-2, NAA: NOT DETECTED

## 2019-03-08 ENCOUNTER — Other Ambulatory Visit: Payer: Self-pay | Admitting: Otolaryngology

## 2019-03-08 DIAGNOSIS — E041 Nontoxic single thyroid nodule: Secondary | ICD-10-CM

## 2019-03-17 ENCOUNTER — Other Ambulatory Visit (HOSPITAL_COMMUNITY): Payer: Self-pay | Admitting: Otolaryngology

## 2019-03-17 ENCOUNTER — Other Ambulatory Visit: Payer: Self-pay | Admitting: Otolaryngology

## 2019-03-17 DIAGNOSIS — E041 Nontoxic single thyroid nodule: Secondary | ICD-10-CM

## 2019-03-26 ENCOUNTER — Ambulatory Visit (HOSPITAL_COMMUNITY)
Admission: RE | Admit: 2019-03-26 | Discharge: 2019-03-26 | Disposition: A | Discharge status: Home / Self Care | Payer: Commercial Managed Care - PPO | Source: Ambulatory Visit | Attending: Otolaryngology | Admitting: Otolaryngology

## 2019-03-26 ENCOUNTER — Other Ambulatory Visit: Payer: Self-pay

## 2019-03-26 DIAGNOSIS — E041 Nontoxic single thyroid nodule: Secondary | ICD-10-CM | POA: Diagnosis present

## 2019-03-26 IMAGING — US US THYROID
1 series · 14 of 25 positions shown · non-contrast
Comparison: [DATE]

CLINICAL DATA: 37-year-old female with thyroid nodules

EXAM:
THYROID ULTRASOUND
TECHNIQUE: Ultrasound examination of the thyroid gland and adjacent soft
tissues was performed.

[Series 1: us thyroid · 0.06mm/px · 14 of 65 slices shown]
[im 1/65]
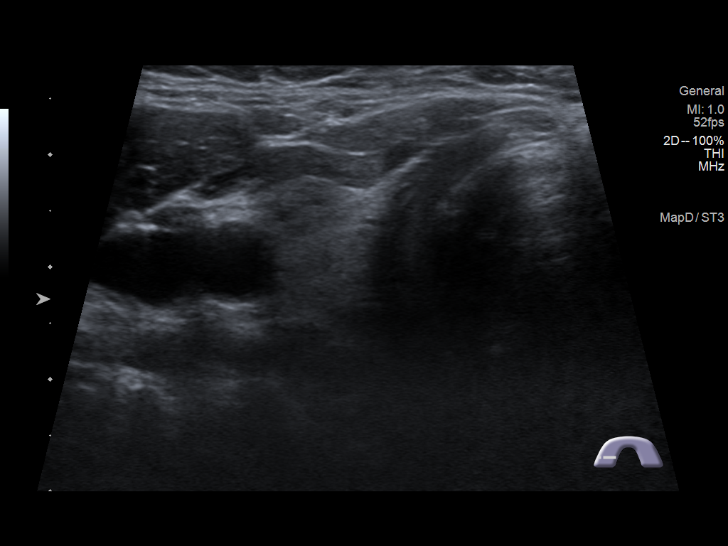
[im 6/65]
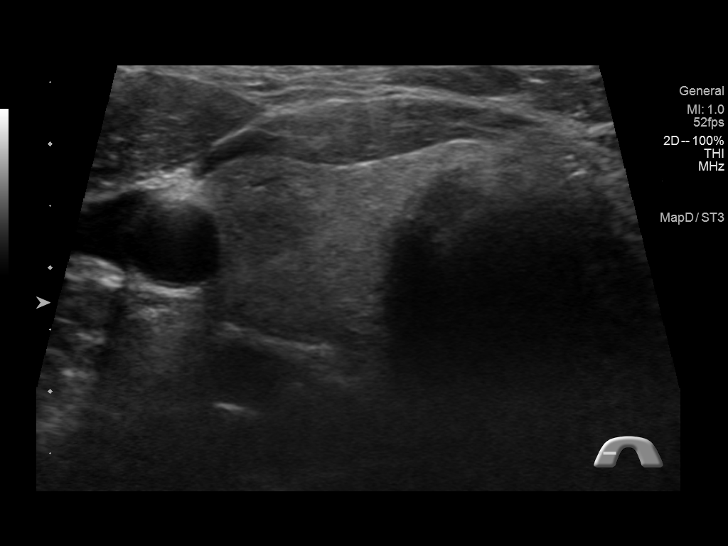
[im 11/65]
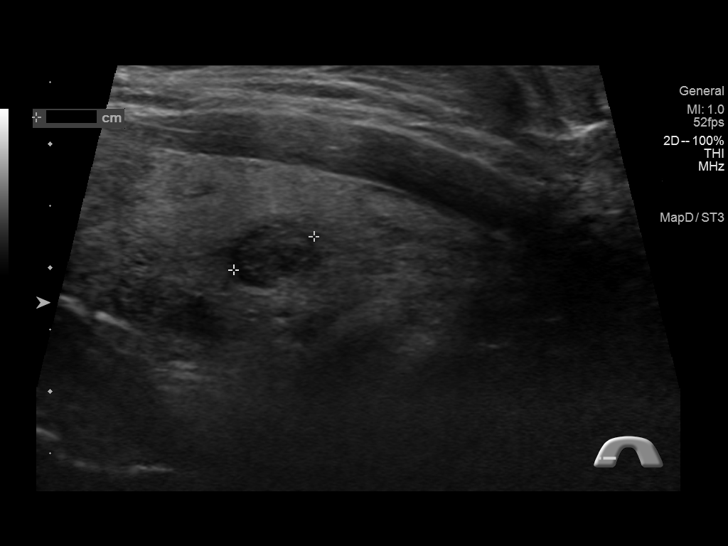
[im 17/65]
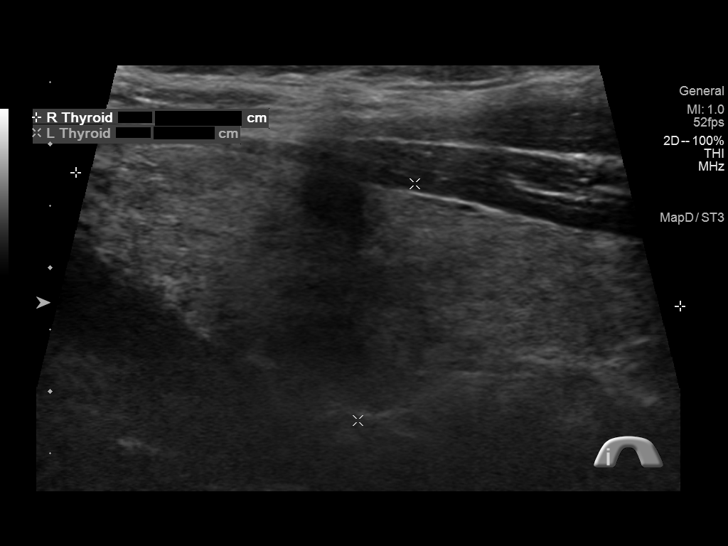
[im 22/65]
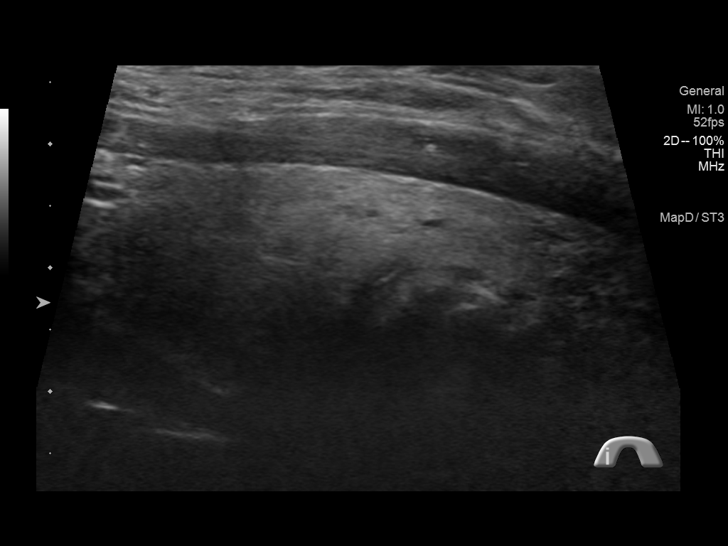
[im 25/65]
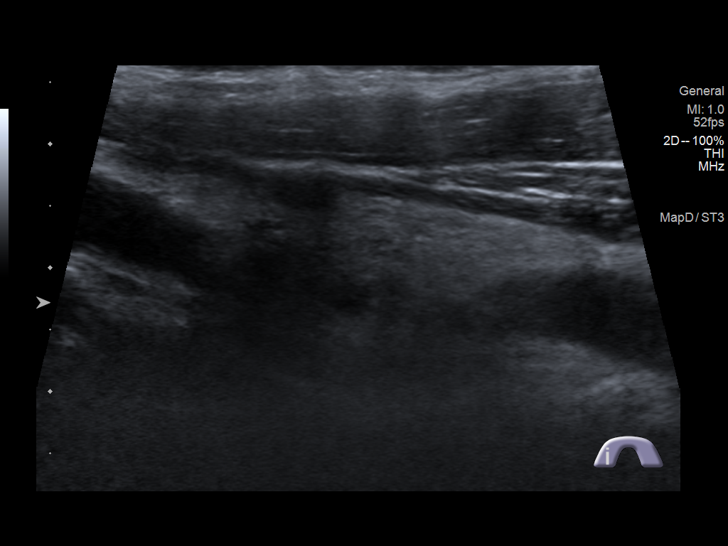
[im 30/65]
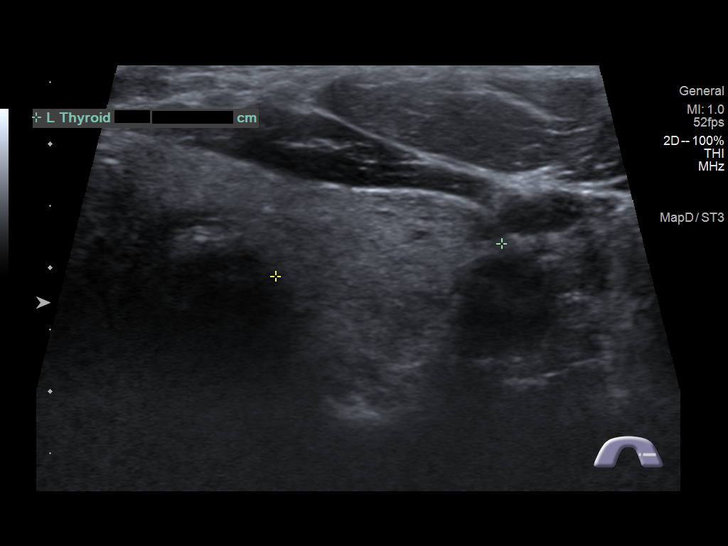
[im 35/65]
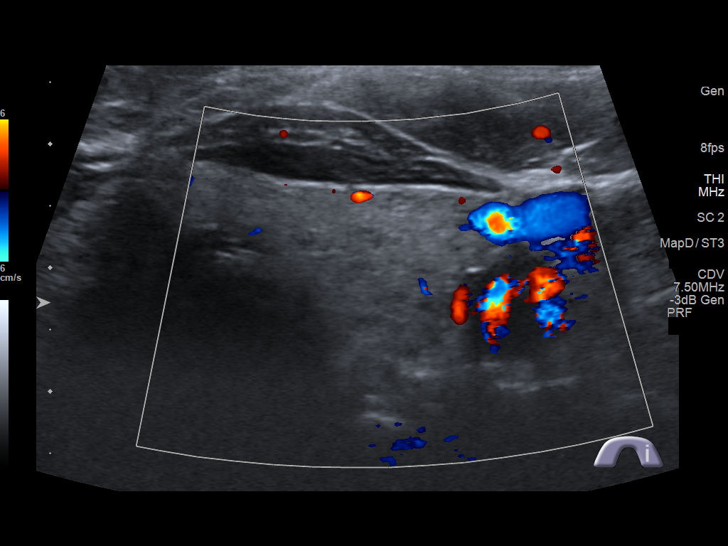
[im 41/65]
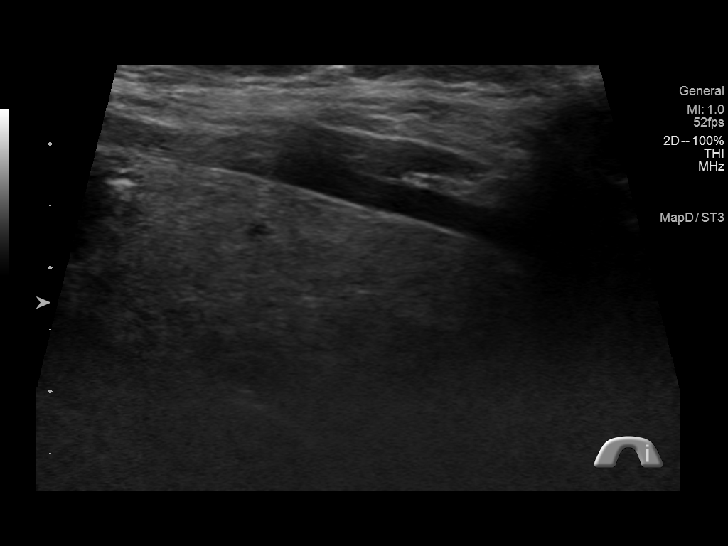
[im 43/65]
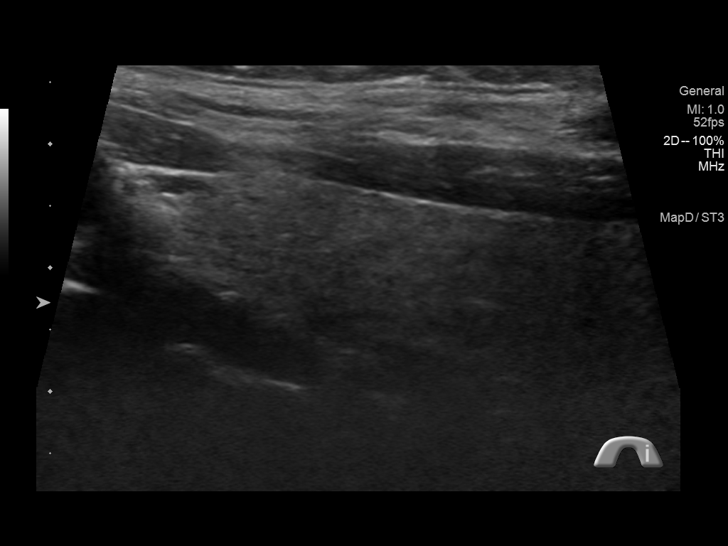
[im 49/65]
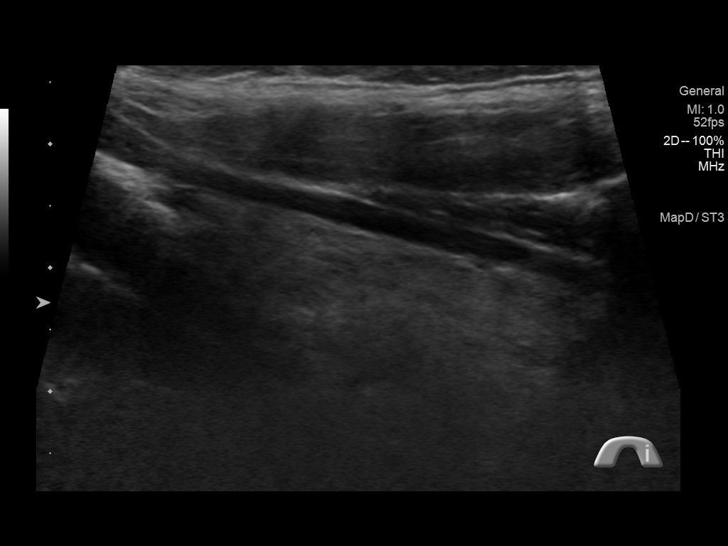
[im 54/65]
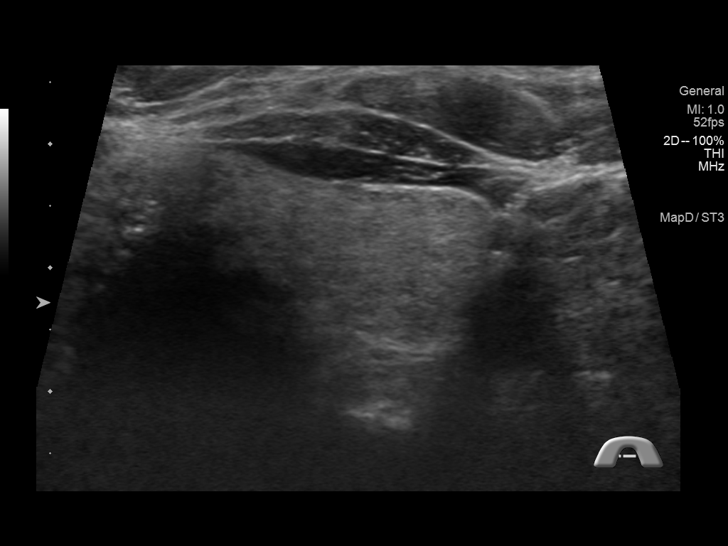
[im 59/65]
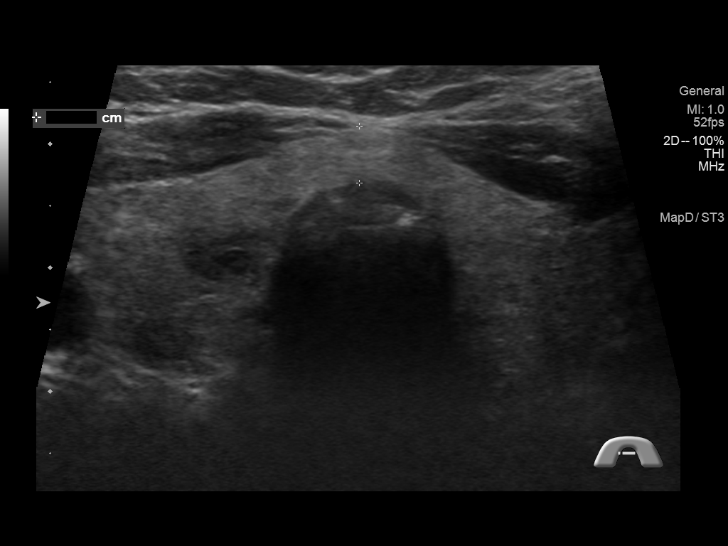
[im 65/65]
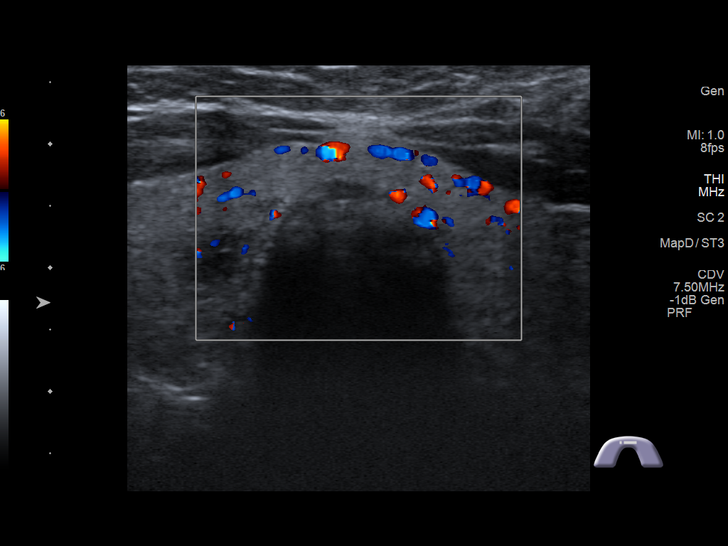

[14 of 25 positions shown; findings below may reference images not displayed]

FINDINGS: Parenchymal Echotexture: Mildly heterogenous

Isthmus: 3 mm

Right lobe: 5.0 cm x 1.6 cm x 1.9 cm

Left lobe: 5.0 cm x 1.8 cm x 1.6 cm

_________________________________________________________

Estimated total number of nodules >/= 1 cm: 0

Number of spongiform nodules >/=  2 cm not described below (TR1): 0

Number of mixed cystic and solid nodules >/= 1.5 cm not described
below (TR2): 0

_________________________________________________________

Nodule labeled 1 in the inferior right thyroid measures 8 mm,
previously 1.3 cm with spongiform characteristics. This does not
meet criteria for surveillance or biopsy

No adenopathy
IMPRESSION: No thyroid nodule meets criteria for biopsy or surveillance, as
designated by the newly established ACR TI-RADS criteria.

Recommendations follow those established by the new ACR TI-RADS
criteria ([HOSPITAL] [QL];[DATE]).

## 2019-05-07 ENCOUNTER — Other Ambulatory Visit: Payer: Self-pay | Admitting: *Deleted

## 2019-05-07 DIAGNOSIS — Z20822 Contact with and (suspected) exposure to covid-19: Secondary | ICD-10-CM

## 2019-05-09 LAB — NOVEL CORONAVIRUS, NAA: SARS-CoV-2, NAA: NOT DETECTED

## 2019-09-09 ENCOUNTER — Telehealth: Payer: Self-pay | Admitting: Family Medicine

## 2019-09-09 MED ORDER — LINACLOTIDE 145 MCG PO CAPS: ORAL_CAPSULE | ORAL | 0 refills | Status: DC

## 2019-09-09 NOTE — Telephone Encounter (Signed)
Pt contacted and informed that Linzess would be sent to pharmacy. Pt states nothing has changes with her health situation, pt states she was unable to afford med but she now has a coupon that she can use that is good through next year. Pt states she will need to call back to schedule appt due to her being at work at time nurse made call.

## 2019-09-09 NOTE — Telephone Encounter (Signed)
Viist on 10/30/17 states she was prescribed but never picked up due to cost

## 2019-09-09 NOTE — Telephone Encounter (Signed)
Pt would like to know if Dr. Nicki Reaper would send in a 90 day script for linaclotide (LINZESS) 145 MCG CAPS capsule.  Pt has a coupon now she is able to use for script.  [place], [place] - [address]

## 2019-09-09 NOTE — Telephone Encounter (Signed)
Nurses It would be fine to send in a prescription Please verify with the patient that nothing is changed with her health situation regarding this medicine Also follow-up by spring time either in person or virtual

## 2019-09-09 NOTE — Telephone Encounter (Signed)
PA for Linzess attempted through CoverMyMeds. Pt does have a coupon she can use. Await decision.

## 2019-09-10 NOTE — Telephone Encounter (Signed)
PA has been approved until 09/08/2020. Pt contacted and verbalized understanding. Pt states she has pick up the script and had to $65. Contacted [facility] and the $65 is with coupon and going through insurance. Pt is ok paying $65.

## 2019-12-09 ENCOUNTER — Ambulatory Visit: Payer: Commercial Managed Care - PPO | Admitting: Family Medicine

## 2019-12-28 ENCOUNTER — Ambulatory Visit (INDEPENDENT_AMBULATORY_CARE_PROVIDER_SITE_OTHER): Payer: Commercial Managed Care - PPO | Admitting: Family Medicine

## 2019-12-28 ENCOUNTER — Encounter: Payer: Self-pay | Admitting: Family Medicine

## 2019-12-28 ENCOUNTER — Other Ambulatory Visit: Payer: Self-pay

## 2019-12-28 VITALS — BP 122/80 | Temp 97.7°F | Ht 69.0 in | Wt 244.0 lb

## 2019-12-28 DIAGNOSIS — L52 Erythema nodosum: Secondary | ICD-10-CM

## 2019-12-28 NOTE — Progress Notes (Signed)
   Subjective:    Patient ID: Dana Downs, female    DOB: 08/01/1981, 39 y.o.   MRN: [id number]  Rash This is a recurrent problem. Episode onset: 2 days. Location: hands and feet. The rash is characterized by pain and redness. Past treatments include nothing.   Started having this problem a few years ago happens every few months will last for couple days at a time sometimes are worse than others Saw the nurse practitioner with the county they ordered a bunch of lab test That [place] will send these lab test to [place] later this week Patient states areas when they occur or red painful and discomforting.  Somewhat tender to the touch somewhat difficult to utilize the hands. No family history of lupus Review of Systems  Skin: Positive for rash.  Denies cough sweats chills weight loss denies bloody or mucousy stools denies joint pain     Objective:   Physical Exam Lungs are clear respiratory rate is normal Hands and feet were inspected.  Evidence of the erythema nodosum was seen  I also reviewed over 11 pictures that patient had regarding the erythema nodosum had a classic appearance       Assessment & Plan:  Erythema nodosum Await the labs from the [place] May need additional testing Possible chest x-ray to rule out TB May need referral to specialist depending on results of test Await labs

## 2020-01-03 ENCOUNTER — Telehealth: Payer: Self-pay | Admitting: Family Medicine

## 2020-01-03 NOTE — Telephone Encounter (Signed)
Initial lab work looks good that she had through the county.  Does not show the reason for her erythema nodosum.  I would recommend a chest x-ray because of erythema nodosum to make sure she does not have thoracic lymphadenopathy.  (Patient recently seen for erythema nodosum, had labs drawn with the county nurse practitioner and forwarded to [place])

## 2020-01-04 ENCOUNTER — Other Ambulatory Visit: Payer: Self-pay | Admitting: *Deleted

## 2020-01-04 DIAGNOSIS — L52 Erythema nodosum: Secondary | ICD-10-CM

## 2020-01-04 NOTE — Telephone Encounter (Signed)
Patient notified and chest x ray ordered. Patient plans to have scan done tomorrow.

## 2020-01-04 NOTE — Telephone Encounter (Signed)
Left message to return call 

## 2020-01-10 ENCOUNTER — Other Ambulatory Visit: Payer: Self-pay

## 2020-01-10 ENCOUNTER — Other Ambulatory Visit: Payer: Self-pay | Admitting: Family Medicine

## 2020-01-10 ENCOUNTER — Encounter (HOSPITAL_COMMUNITY): Payer: Self-pay

## 2020-01-10 ENCOUNTER — Ambulatory Visit (HOSPITAL_COMMUNITY)
Admission: RE | Admit: 2020-01-10 | Discharge: 2020-01-10 | Disposition: A | Discharge status: Home / Self Care | Payer: Commercial Managed Care - PPO | Source: Ambulatory Visit | Attending: Family Medicine | Admitting: Family Medicine

## 2020-01-10 ENCOUNTER — Ambulatory Visit (HOSPITAL_COMMUNITY): Admission: RE | Admit: 2020-01-10 | Payer: Commercial Managed Care - PPO | Source: Ambulatory Visit

## 2020-01-10 DIAGNOSIS — L52 Erythema nodosum: Secondary | ICD-10-CM | POA: Insufficient documentation

## 2020-01-10 IMAGING — DX DG CHEST 2V
2 series · 2 of 2 positions shown · non-contrast
Comparison: None.

CLINICAL DATA: Erythema nodosum

EXAM:
CHEST - 2 VIEW

[chest pa]
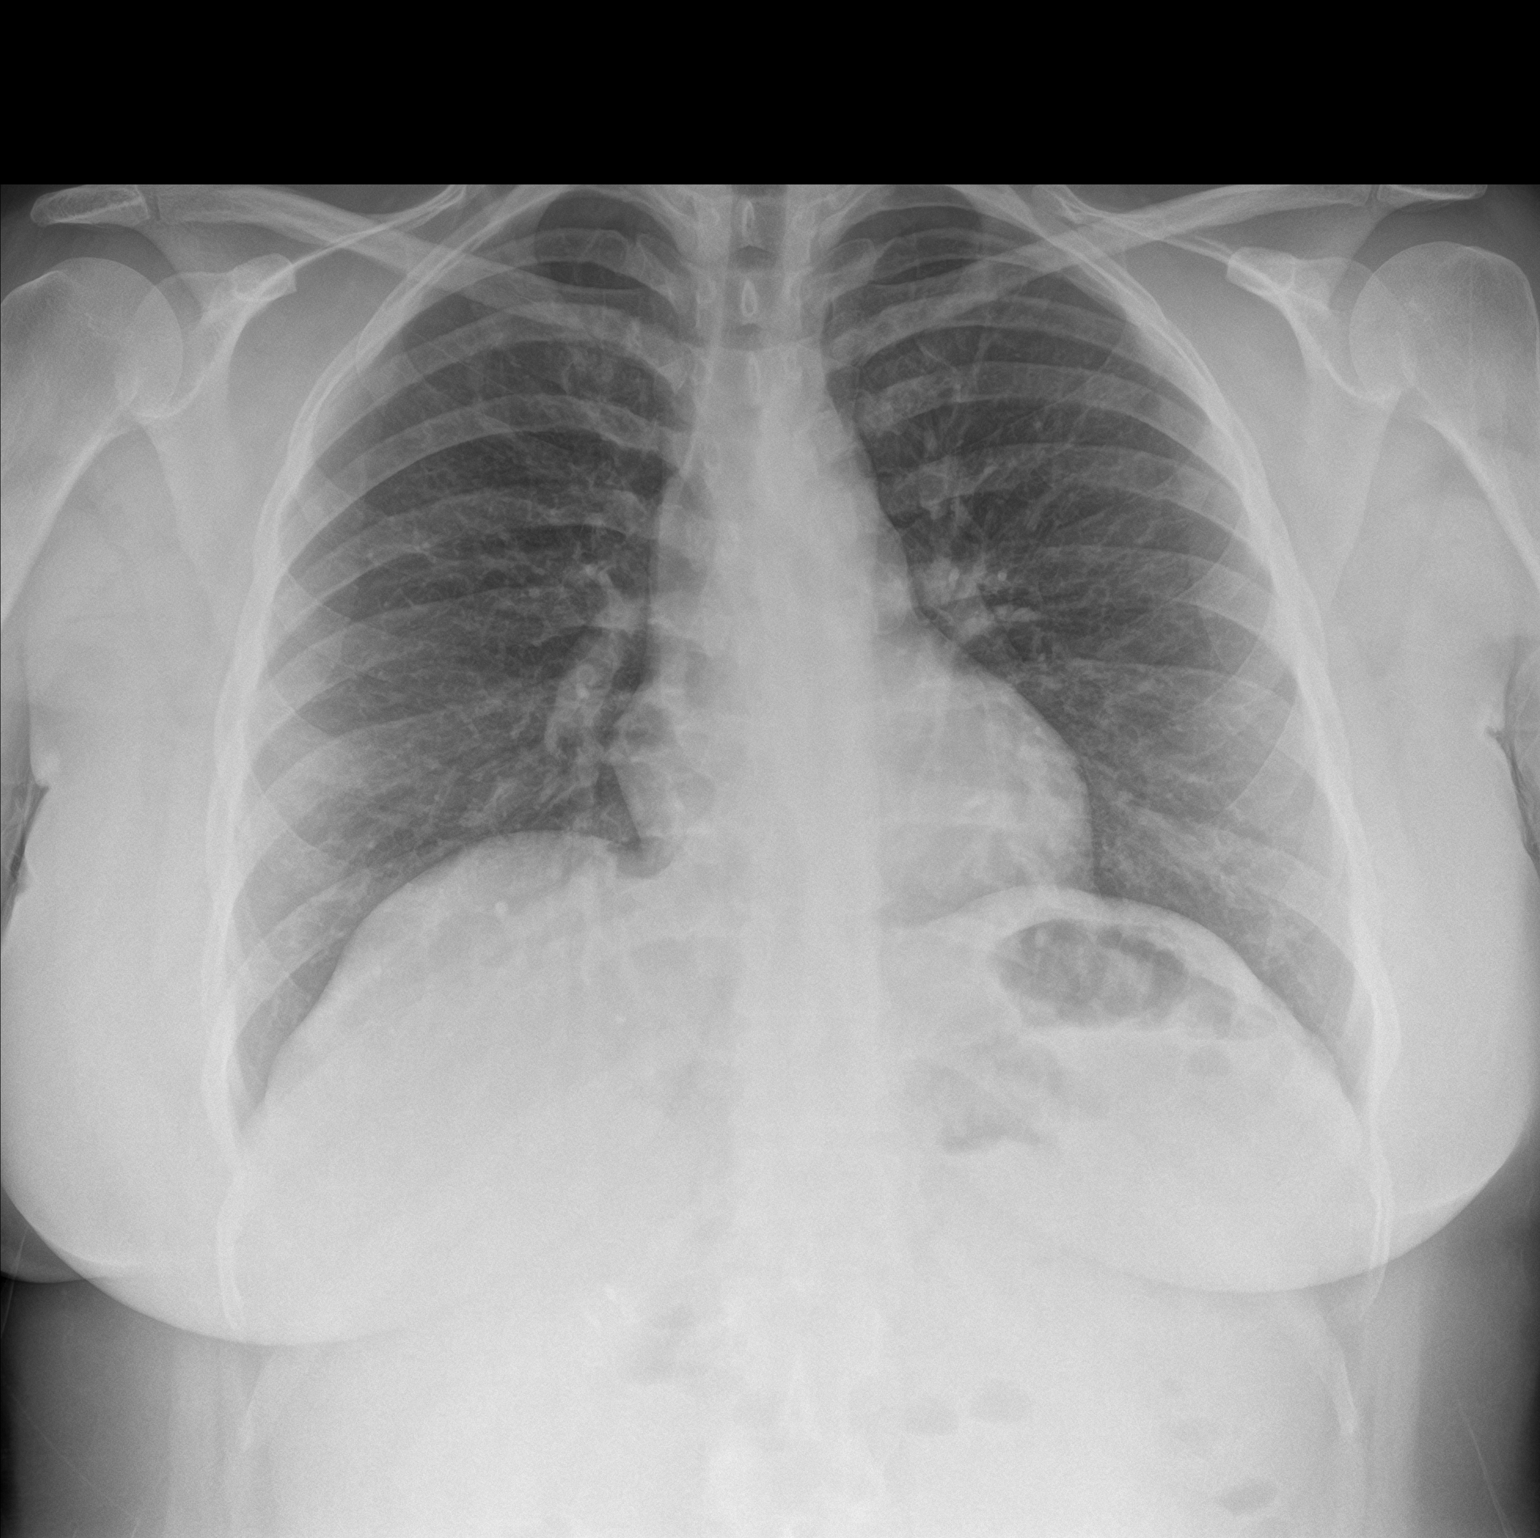

[chest lat]
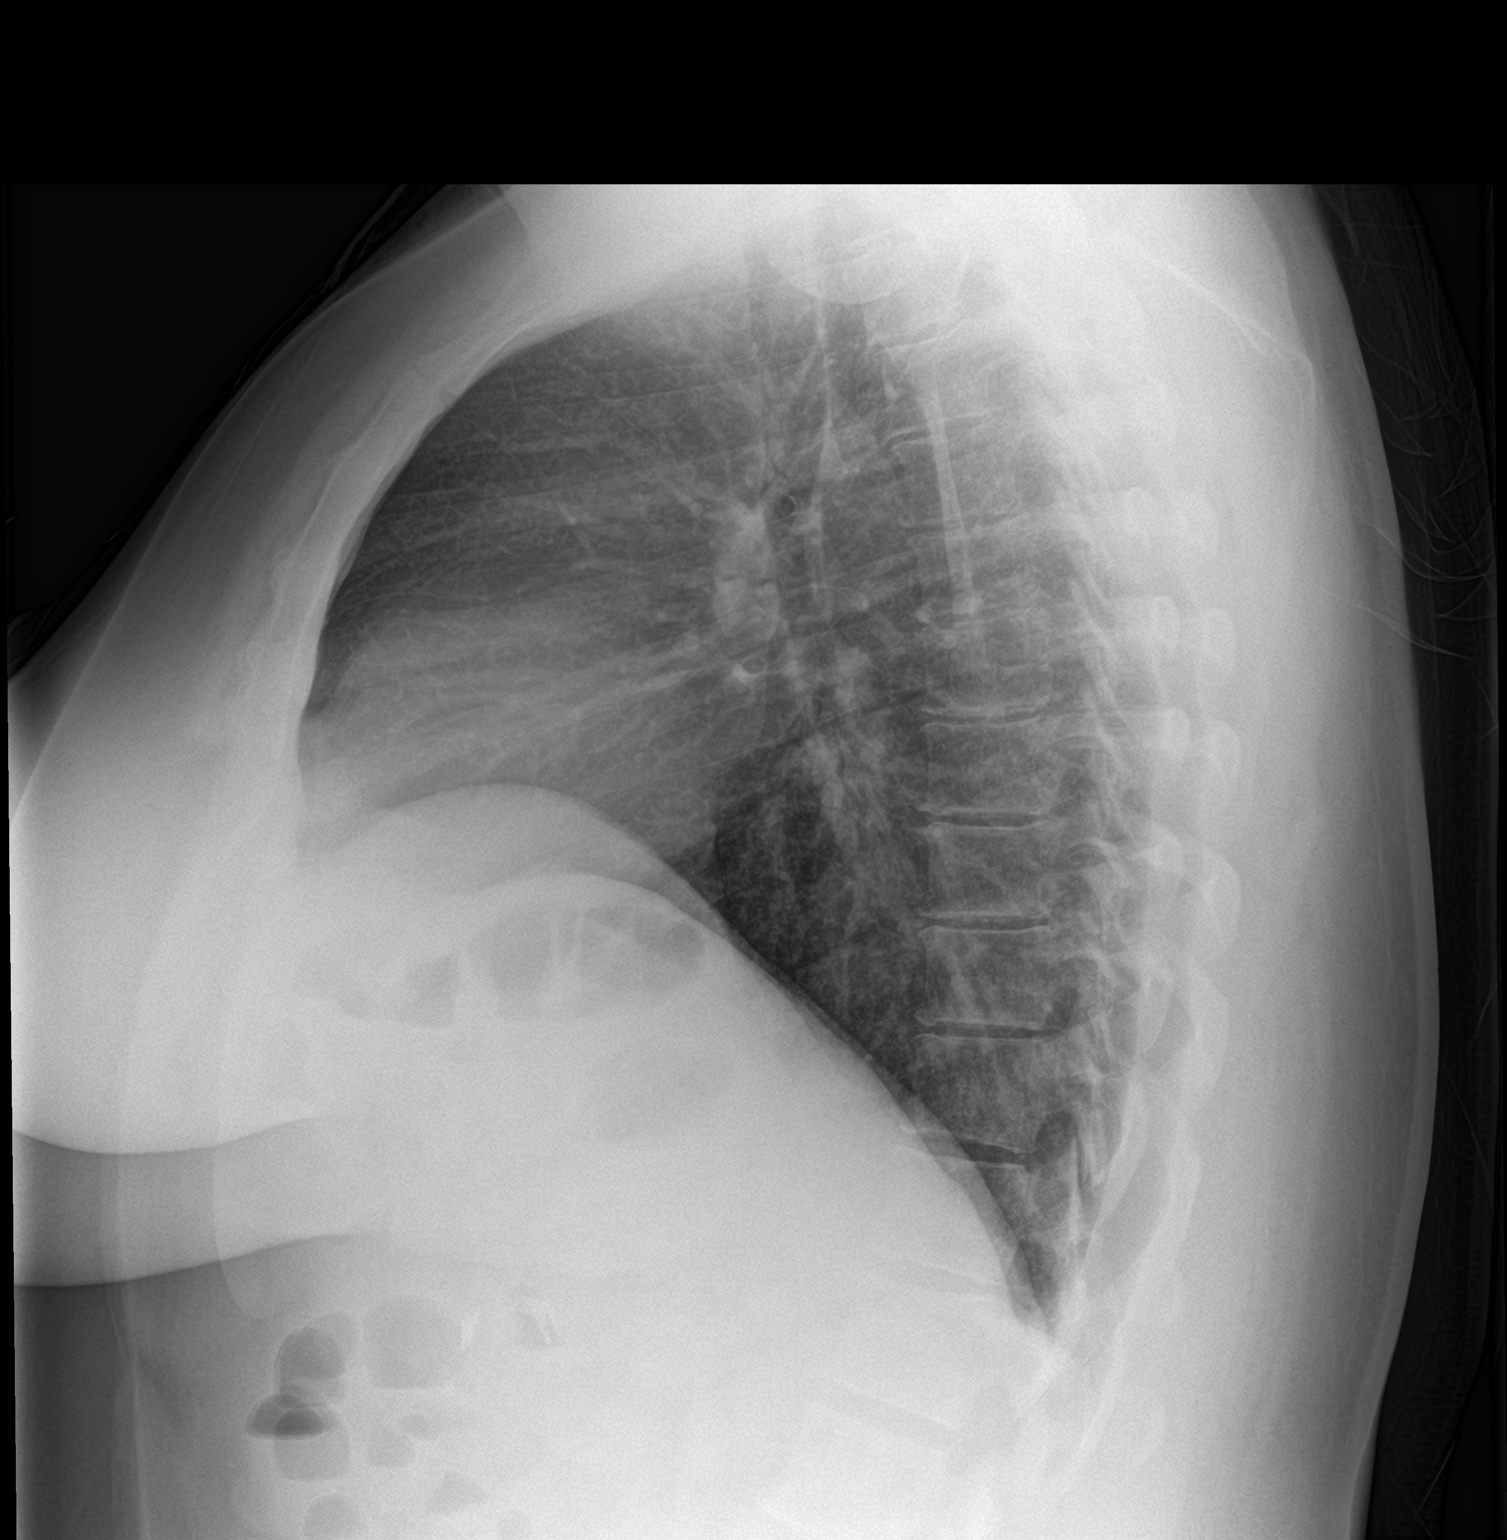

[2 of 2 positions shown; findings below may reference images not displayed]

FINDINGS: No consolidation, features of edema, pneumothorax, or effusion. No
bulky mediastinal adenopathy. The cardiomediastinal contours are
unremarkable. Surgical clips in the right upper quadrant. No acute
osseous or soft tissue abnormality.
IMPRESSION: 1. No acute cardiopulmonary abnormality.
2. No bulky mediastinal adenopathy.
3. Cholecystectomy.

## 2020-01-27 ENCOUNTER — Encounter: Payer: Self-pay | Admitting: Family Medicine

## 2020-01-27 NOTE — Telephone Encounter (Signed)
Chest xray on 6/7

## 2020-01-27 NOTE — Telephone Encounter (Signed)
Nurses Chest x-ray is normal Please let me speak with Claiborne Billings regarding where we will go from here.

## 2020-01-28 ENCOUNTER — Other Ambulatory Visit: Payer: Self-pay | Admitting: Family Medicine

## 2020-01-28 NOTE — Telephone Encounter (Signed)
Left message to return call 

## 2020-01-28 NOTE — Telephone Encounter (Signed)
Pt called back and told me she already spoke with Dr. Nicki Reaper about xray.

## 2020-02-02 ENCOUNTER — Encounter: Payer: Self-pay | Admitting: Family Medicine

## 2020-02-02 ENCOUNTER — Other Ambulatory Visit: Payer: Self-pay | Admitting: *Deleted

## 2020-02-02 DIAGNOSIS — L52 Erythema nodosum: Secondary | ICD-10-CM

## 2020-02-10 ENCOUNTER — Encounter: Payer: Self-pay | Admitting: Family Medicine

## 2020-03-15 ENCOUNTER — Encounter: Payer: Self-pay | Admitting: Family Medicine

## 2020-03-16 ENCOUNTER — Other Ambulatory Visit: Payer: Self-pay | Admitting: *Deleted

## 2020-03-16 MED ORDER — LINACLOTIDE 145 MCG PO CAPS: ORAL_CAPSULE | ORAL | 0 refills | Status: DC

## 2020-03-16 NOTE — Telephone Encounter (Signed)
Nurses Please look into this order I am fine with improving this So far I have not seen this coming from [facility] yet  Thanks-Dr. Nicki Reaper

## 2020-03-30 ENCOUNTER — Encounter: Payer: Self-pay | Admitting: Family Medicine

## 2020-05-17 ENCOUNTER — Other Ambulatory Visit: Payer: Self-pay

## 2020-05-17 ENCOUNTER — Other Ambulatory Visit: Payer: Commercial Managed Care - PPO

## 2020-05-17 DIAGNOSIS — Z20822 Contact with and (suspected) exposure to covid-19: Secondary | ICD-10-CM

## 2020-05-18 ENCOUNTER — Ambulatory Visit: Payer: Self-pay | Admitting: *Deleted

## 2020-05-18 LAB — NOVEL CORONAVIRUS, NAA: SARS-CoV-2, NAA: DETECTED — AB

## 2020-05-18 LAB — SARS-COV-2, NAA 2 DAY TAT

## 2020-05-18 NOTE — Telephone Encounter (Signed)
Pt notified of positive COVID-19 test results. Pt verbalized understanding. Pt reports that they are feeling sinus congestion and cough.Pt advised to remain in self quarantine until at least 10 days since symptom onset And at least 24 hours fever free without antipyretics And improvement in respiratory symptoms. Patient advised to utilize over the counter medications to treat symptoms. Pt advised to seek treatment in the ED if respiratory issues/distress develops.Pt advised they should only leave home to seek and medical care and must wear a mask in public. Pt instructed to limit contact with family members or caregivers in the home. Pt advised to practice social distancing and to continue to use good preventative care measures such has frequent hand washing, staying out of crowds and cleaning hard surfaces frequently touched in the home.Pt informed that the health department will likely follow up and may have additional recommendations. Will notify [facility] Department.

## 2020-05-19 ENCOUNTER — Other Ambulatory Visit: Payer: Self-pay | Admitting: Nurse Practitioner

## 2020-05-19 ENCOUNTER — Telehealth (HOSPITAL_COMMUNITY): Payer: Self-pay

## 2020-05-19 ENCOUNTER — Encounter: Payer: Self-pay | Admitting: Family Medicine

## 2020-05-19 MED ORDER — PREDNISONE 20 MG PO TABS: ORAL_TABLET | ORAL | 0 refills | Status: DC

## 2020-05-19 MED ORDER — ALBUTEROL SULFATE HFA 108 (90 BASE) MCG/ACT IN AERS: 2.0000 | INHALATION_SPRAY | RESPIRATORY_TRACT | 0 refills | Status: DC | PRN

## 2020-05-19 NOTE — Telephone Encounter (Signed)
See phone note and orders

## 2020-05-19 NOTE — Telephone Encounter (Signed)
Called to Discuss with patient about Covid symptoms and the use of the monoclonal antibody infusion for those with mild to moderate Covid symptoms and at a high risk of hospitalization.     Pt appears to qualify for this infusion due to co-morbid conditions (HTN) and/or a member of an at-risk group in accordance with the FDA Emergency Use Authorization.    Unable to reach pt; left vm requesting call back to [facility] hotline to discuss further.   Riordan Walle Lorita Officer, RN

## 2020-05-19 NOTE — Progress Notes (Signed)
Spoke with patient over the phone. Spasmodic cough with deep breath. Slight intermittent wheezing. Can talk on the phone with occasional cough. Sounds congested. Taking fluids well. Voiding nl. Has been eating some although no taste or smell. She is low risk for complications. Non smoker. Children are in the home. Patient and her husband are taking precautions.  Will send in Prednisone taper and albuterol inhaler.  Warning signs reviewed. Go to ED over the weekend if worse. Call back next week if no improvement. Verbalizes understanding and agrees with plan.

## 2020-06-01 ENCOUNTER — Other Ambulatory Visit: Payer: Self-pay

## 2020-06-01 ENCOUNTER — Ambulatory Visit (INDEPENDENT_AMBULATORY_CARE_PROVIDER_SITE_OTHER): Payer: Commercial Managed Care - PPO | Admitting: Family Medicine

## 2020-06-01 ENCOUNTER — Encounter: Payer: Self-pay | Admitting: Family Medicine

## 2020-06-01 VITALS — BP 128/86 | HR 96 | Temp 96.6°F | Ht 69.0 in | Wt 262.0 lb

## 2020-06-01 DIAGNOSIS — M79641 Pain in right hand: Secondary | ICD-10-CM

## 2020-06-01 DIAGNOSIS — R200 Anesthesia of skin: Secondary | ICD-10-CM | POA: Diagnosis not present

## 2020-06-01 NOTE — Progress Notes (Signed)
Pt having numbness in right thumb. Going on about 3 weeks. Loss of feeling but if pushes on thumb, painful. Pins and needles sensation up through middle part of arm.     Patient ID: Dana Downs, female    DOB: [birth year], 39 y.o.   MRN: [id number]   Chief Complaint  Patient presents with  . Numbness   Subjective:  CC: right numbness x 3 weeks  Presents today with a 3-week history of right thumb numbness.  Describes her thumb feels totally numb, can feel pressure when touching it, numbness goes up to the elbow, and tingling goes up to the shoulder.  Denies injury, denies swelling, has tried ibuprofen for the headache when she had recent Covid infection, does not recall that it improved this discomfort.  This is her dominant hand, it is affecting her ability to do her job, as she has the possibility of using a firearm.  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    Medical History Dana Downs has a past medical history of Abnormal Pap smear, Constipation, chronic, Dysplasia, GERD (gastroesophageal reflux disease), Gestational diabetes, Headache(784.0), HPV in female, and Polycystic ovarian syndrome.   Outpatient Encounter Medications as of 06/01/2020  Medication Sig  . linaclotide (LINZESS) 145 MCG CAPS capsule TAKE 1 CAPSULE BY MOUTH EVERY DAY BEFORE BREAKFAST  . meclizine (ANTIVERT) 25 MG tablet Take 1 tablet (25 mg total) by mouth 3 (three) times daily as needed for dizziness.  . [DISCONTINUED] albuterol (VENTOLIN HFA) 108 (90 Base) MCG/ACT inhaler Inhale 2 puffs into the lungs every 4 (four) hours as needed for wheezing or shortness of breath.  . [DISCONTINUED] CONTRAVE 8-90 MG TB12 TAKE 1 TABLET BY MOUTH IN THE MORNING FOR 7 DAYS. 1 TABLET TWICE DAILY FOR 7 DAYS. TAKE 2 TABLETS IN THE MORNING AND 1 TABLET EVERY EVENING  . [DISCONTINUED] predniSONE (DELTASONE) 20 MG tablet 3 po qd x 3 d then 2 po qd x 3 d then 1 po qd x 2 d   No facility-administered encounter medications on file as  of 06/01/2020.     Review of Systems  Constitutional: Negative for chills and fever.  Respiratory: Negative for shortness of breath.   Cardiovascular: Negative for chest pain.  Gastrointestinal: Negative for abdominal pain.     Vitals BP 128/86   Pulse 96   Temp (!) 96.6 F (35.9 C)   Ht 5\' 9"  (1.753 m)   Wt 262 lb (118.8 kg)   SpO2 98%   BMI 38.69 kg/m   Objective:   Physical Exam Vitals and nursing note reviewed.  Constitutional:      Appearance: Normal appearance.  Cardiovascular:     Rate and Rhythm: Normal rate and regular rhythm.     Pulses: Normal pulses.     Heart sounds: Normal heart sounds.  Pulmonary:     Effort: Pulmonary effort is normal.     Breath sounds: Normal breath sounds.  Musculoskeletal:        General: No swelling, tenderness, deformity or signs of injury.  Skin:    General: Skin is warm and dry.  Neurological:     General: No focal deficit present.     Mental Status: She is alert and oriented to person, place, and time.     Cranial Nerves: No cranial nerve deficit.     Sensory: Sensory deficit present.     Motor: Weakness present.     Coordination: Coordination normal.     Gait: Gait normal.  Comments: Slight weakness right upper extremity. Sensory deficit right thumb. Numbness up to elbow right arm. Tingling radiates to right shoulder.  Psychiatric:        Mood and Affect: Mood normal.        Behavior: Behavior normal.        Thought Content: Thought content normal.        Judgment: Judgment normal.      Assessment and Plan   1. 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  Referral sent.  Agrees with plan of care discussed today. Understands warning signs to seek further care: Increasing  weakness or numbness. Understands to follow-up with neurology, for a nerve conduction study. Work note given with restrictions regarding the use of her right hand. She will have neurology to send any reports to this office.  She can follow-up with [place] as needed.   Chalmers Guest, NP 06/01/2020

## 2020-06-08 ENCOUNTER — Encounter: Payer: Self-pay | Admitting: *Deleted

## 2020-06-08 ENCOUNTER — Encounter: Payer: Self-pay | Admitting: Neurology

## 2020-06-08 ENCOUNTER — Ambulatory Visit (INDEPENDENT_AMBULATORY_CARE_PROVIDER_SITE_OTHER): Payer: Commercial Managed Care - PPO | Admitting: Neurology

## 2020-06-08 ENCOUNTER — Telehealth: Payer: Self-pay | Admitting: Neurology

## 2020-06-08 VITALS — BP 127/81 | HR 86 | Ht 69.0 in | Wt 252.5 lb

## 2020-06-08 DIAGNOSIS — M79644 Pain in right finger(s): Secondary | ICD-10-CM | POA: Insufficient documentation

## 2020-06-08 DIAGNOSIS — R202 Paresthesia of skin: Secondary | ICD-10-CM | POA: Diagnosis not present

## 2020-06-08 DIAGNOSIS — G43709 Chronic migraine without aura, not intractable, without status migrainosus: Secondary | ICD-10-CM

## 2020-06-08 MED ORDER — DICLOFENAC SODIUM 1 % EX CREA: TOPICAL_CREAM | CUTANEOUS | 11 refills | Status: DC

## 2020-06-08 MED ORDER — SUMATRIPTAN SUCCINATE 50 MG PO TABS: 50.0000 mg | ORAL_TABLET | ORAL | 6 refills | Status: DC | PRN

## 2020-06-08 NOTE — Progress Notes (Signed)
Chief Complaint  Patient presents with  . New Patient (Initial Visit)    Reports constant right thumb numbness. She also has pain, along with pins and needles sensation, in right hand, forearm and elbow. Referred to be evaluated for possible NCV/EMG.  . PCP    Kathyrn Drown, MD    HISTORICAL  Dana Downs is a 39 year old female, seen in request by her primary care physician Dr. Wolfgang Phoenix, Nicki Reaper for evaluation of right hand paresthesia, initial evaluation was on June 08, 2020  I reviewed and summarized the referring note.   She works as a Engineer, structural, began to complains of right thumb numbness since September 2021, she just got married October 2, following that, she was sick with Covid, numbness involving right thumb, extending to right radial wrist, sometimes could not feel her finger into the holster, also intermittent right thumb pain, because of that, she was put on light duty, but she denies persistent right hand sensory deficit or weakness  She denies a previous history of headaches, since September, she also began to notice intermittent headaches, holoacranial, 3 times a week, during severe headache she also has light noise sensitivity, nausea, worsening by movement, occasionally with dizziness, she also complains of intermittent lightheaded dizziness sensation as not associated with her headaches,  Intermittent left foot numbness, she denies visual loss, no gait abnormality,  Her mother has the diagnosis of multiple sclerosis  Lab evaluation in May 2021, showed normal CMP, CBC, TSH, ESR, CRP,   REVIEW OF SYSTEMS: Full 14 system review of systems performed and notable only for as above All other review of systems were negative.  ALLERGIES: Allergies  Allergen Reactions  . Penicillins Anaphylaxis  . Acyclovir And Related   . Doxycycline Hives  . Sulfa Antibiotics Hives  . Sulphur [Sulfur Sublimed] Hives    HOME MEDICATIONS: Current Outpatient Medications    Medication Sig Dispense Refill  . linaclotide (LINZESS) 145 MCG CAPS capsule TAKE 1 CAPSULE BY MOUTH EVERY DAY BEFORE BREAKFAST 90 capsule 0  . meclizine (ANTIVERT) 25 MG tablet Take 1 tablet (25 mg total) by mouth 3 (three) times daily as needed for dizziness. 30 tablet 0   No current facility-administered medications for this visit.    PAST MEDICAL HISTORY: Past Medical History:  Diagnosis Date  . Abnormal Pap smear   . Constipation, chronic   . Dysplasia   . GERD (gastroesophageal reflux disease)    heartburn with pregnancy-occasional  . Gestational diabetes   . Headache(784.0)   . HPV in female   . Numbness    right thumb  . Pins and needles sensation    right arm  . Polycystic ovarian syndrome     PAST SURGICAL HISTORY: Past Surgical History:  Procedure Laterality Date  . CESAREAN SECTION  09/02/2011   Procedure: CESAREAN SECTION;  Surgeon: Marylynn Pearson, MD;  Location: [facility];  Service: Gynecology;  Laterality: N/A;  Primary cesarean section  . CHOLECYSTECTOMY  2013  . NECK SURGERY  age 66-gland removal   atypical microbacterial infection  . TOOTH EXTRACTION      FAMILY HISTORY: Family History  Problem Relation Age of Onset  . Hypertension Mother   . Hyperlipidemia Father   . Cancer Paternal Grandmother        breast  . Diabetes Paternal Grandmother     SOCIAL HISTORY: Social History   Socioeconomic History  . Marital status: Married    Spouse name: Not on file  . Number of children:  1  . Years of education: college  . Highest education level: Not on file  Occupational History  . Occupation: Dectective  Tobacco Use  . Smoking status: Former Smoker    Quit date: 09/18/2009    Years since quitting: 10.7  . Smokeless tobacco: Never Used  Substance and Sexual Activity  . Alcohol use: No  . Drug use: No  . Sexual activity: Not on file  Other Topics Concern  . Not on file  Social History Narrative   Lives at home with family.   One biological  daughter and one stepdaughter.   Right-handed.   Caffeine use: 16oz bottle of Coke per day.   Social Determinants of Health   Financial Resource Strain:   . Difficulty of Paying Living Expenses: Not on file  Food Insecurity:   . Worried About Charity fundraiser in the Last Year: Not on file  . Ran Out of Food in the Last Year: Not on file  Transportation Needs:   . Lack of Transportation (Medical): Not on file  . Lack of Transportation (Non-Medical): Not on file  Physical Activity:   . Days of Exercise per Week: Not on file  . Minutes of Exercise per Session: Not on file  Stress:   . Feeling of Stress : Not on file  Social Connections:   . Frequency of Communication with Friends and Family: Not on file  . Frequency of Social Gatherings with Friends and Family: Not on file  . Attends Religious Services: Not on file  . Active Member of Clubs or Organizations: Not on file  . Attends Archivist Meetings: Not on file  . Marital Status: Not on file  Intimate Partner Violence:   . Fear of Current or Ex-Partner: Not on file  . Emotionally Abused: Not on file  . Physically Abused: Not on file  . Sexually Abused: Not on file     PHYSICAL EXAM   Vitals:   06/08/20 0921  BP: 127/81  Pulse: 86  Weight: 252 lb 8 oz (114.5 kg)  Height: 5' 9"  (1.753 m)   Not recorded     Body mass index is 37.29 kg/m.  PHYSICAL EXAMNIATION:  Gen: NAD, conversant, well nourised, well groomed                     Cardiovascular: Regular rate rhythm, no peripheral edema, warm, nontender. Eyes: Conjunctivae clear without exudates or hemorrhage Neck: Supple, no carotid bruits. Pulmonary: Clear to auscultation bilaterally   NEUROLOGICAL EXAM:  MENTAL STATUS: Speech:    Speech is normal; fluent and spontaneous with normal comprehension.  Cognition:     Orientation to time, place and person     Normal recent and remote memory     Normal Attention span and concentration     Normal  Language, naming, repeating,spontaneous speech     Fund of knowledge   CRANIAL NERVES: CN II: Visual fields are full to confrontation. Pupils are round equal and briskly reactive to light. CN III, IV, VI: extraocular movement are normal. No ptosis. CN V: Facial sensation is intact to light touch CN VII: Face is symmetric with normal eye closure  CN VIII: Hearing is normal to causal conversation. CN IX, X: Phonation is normal. CN XI: Head turning and shoulder shrug are intact  MOTOR: There is no pronator drift of out-stretched arms. Muscle bulk and tone are normal. Muscle strength is normal.  REFLEXES: Reflexes are 2+ and symmetric at the  biceps, triceps, knees, and ankles. Plantar responses are flexor.  SENSORY: Intact to light touch, pinprick and vibratory sensation are intact in fingers and toes.  COORDINATION: There is no trunk or limb dysmetria noted.  GAIT/STANCE: Posture is normal. Gait is steady with normal steps, base, arm swing, and turning. Heel and toe walking are normal. Tandem gait is normal.  Romberg is absent.   DIAGNOSTIC DATA (LABS, IMAGING, TESTING) - I reviewed patient records, labs, notes, testing and imaging myself where available.   ASSESSMENT AND PLAN  Dana Downs is a 39 y.o. female   Paresthesia of right hand New onset frequent headaches,  Family history of multiple sclerosis  She was put on light duty as a Engineer, structural due to right hand paresthesia,  Need to rule out central nervous system and the peripheral nervous system etiology  MRI of the brain, cervical spine  EMG nerve conduction study  Her headache has migraine features, Imitrex 50 mg as needed  Marcial Pacas, M.D. Ph.D.  [facility] [address] [place], [place] [address] Ph: [phone] Fax: [phone]  CC:  Kathyrn Drown, [place],  [place] [address]

## 2020-06-08 NOTE — Telephone Encounter (Signed)
Pt called and stated that when she was looking at her after visit summery she noticed that the new headache medication was not put on there. Pt would like to make sure that it is not forgotten to be called in. Please advise.

## 2020-06-08 NOTE — Telephone Encounter (Signed)
I spoke to Dr. Krista Blue and she has sent the patient in a prescription for sumatriptan 50mg  tablets for a headache rescue medication. I returned the call to the patient and she is agreeable to this treatment. We reviewed the instructions and she verbalized understanding.

## 2020-06-13 ENCOUNTER — Other Ambulatory Visit: Payer: Self-pay | Admitting: Neurology

## 2020-06-13 MED ORDER — ALPRAZOLAM 1 MG PO TABS: ORAL_TABLET | ORAL | 0 refills | Status: DC

## 2020-06-13 NOTE — Addendum Note (Signed)
Addended by: Noberto Retort C on: 06/13/2020 12:06 PM   Modules accepted: Orders

## 2020-06-13 NOTE — Telephone Encounter (Signed)
LVM for pt to call back about scheduling mri   [facility] auth: [id number] (exp. 06/13/20 to 07/12/20)

## 2020-06-13 NOTE — Telephone Encounter (Signed)
Patient returned my call she is scheduled at [facility] for 06/28/20.  Patient also informed me she is claustrophobic and would like something to help her. She is aware to have a driver.

## 2020-06-13 NOTE — Telephone Encounter (Signed)
I returned the call to the patient. Allergies confirmed. Alprazolam 1mg , per MRI protocol, sent to MD for approval. The patient is aware that she must have a driver to and from the MRI appt.

## 2020-06-19 ENCOUNTER — Other Ambulatory Visit: Payer: Self-pay | Admitting: Family Medicine

## 2020-06-26 ENCOUNTER — Ambulatory Visit (INDEPENDENT_AMBULATORY_CARE_PROVIDER_SITE_OTHER): Payer: Commercial Managed Care - PPO | Admitting: Neurology

## 2020-06-26 ENCOUNTER — Encounter: Payer: Self-pay | Admitting: Neurology

## 2020-06-26 ENCOUNTER — Encounter (INDEPENDENT_AMBULATORY_CARE_PROVIDER_SITE_OTHER): Payer: Commercial Managed Care - PPO | Admitting: Neurology

## 2020-06-26 ENCOUNTER — Other Ambulatory Visit: Payer: Self-pay

## 2020-06-26 DIAGNOSIS — R202 Paresthesia of skin: Secondary | ICD-10-CM | POA: Diagnosis not present

## 2020-06-26 DIAGNOSIS — Z0289 Encounter for other administrative examinations: Secondary | ICD-10-CM

## 2020-06-26 DIAGNOSIS — M79644 Pain in right finger(s): Secondary | ICD-10-CM

## 2020-06-26 DIAGNOSIS — G43709 Chronic migraine without aura, not intractable, without status migrainosus: Secondary | ICD-10-CM

## 2020-06-26 NOTE — Progress Notes (Signed)
Emg report is under procedure

## 2020-06-26 NOTE — Procedures (Signed)
        Full Name: Dana Downs Gender: Female MRN #: [id number] Date of Birth: Dec 11, 1980    Visit Date: 06/26/2020 07:21 Age: 39 Years Examining Physician: Marcial Pacas, MD  Referring Physician: Marcial Pacas, MD; Pecolia Ades, NP History: 38 year old female complains of right thumb discomfort  Summary of the test: Nerve conduction study: Right median sensory motor responses were normal.  Electromyography: Selected needle examination of right upper extremity muscles and right cervical paraspinal muscles were normal.   Conclusion: This is a normal study.  There is no electrodiagnostic evidence of right upper extremity neuropathy or right cervical radiculopathy.    ------------------------------- Marcial Pacas, M.D. PhD  [facility] [address], [place] [address] Tel: [phone] Fax: [phone]  Verbal informed consent was obtained from the patient, patient was informed of potential risk of procedure, including bruising, bleeding, hematoma formation, infection, muscle weakness, muscle pain, numbness, among others.         [place]    Nerve / Sites Muscle Latency Ref. Amplitude Ref. Rel Amp Segments Distance Velocity Ref. Area    ms ms mV mV %  cm m/s m/s mVms  R Median - APB     Wrist APB 3.2 ?4.4 7.7 ?4.0 100 Wrist - APB 7   30.3     Upper arm APB 7.6  7.5  97.8 Upper arm - Wrist 24 55 ?49 29.2  R Ulnar - ADM     Wrist ADM 2.6 ?3.3 6.8 ?6.0 100 Wrist - ADM 7   25.1     B.Elbow ADM 6.3  6.4  95.3 B.Elbow - Wrist 25 69 ?49 25.0     A.Elbow ADM 7.8  6.4  99.7 A.Elbow - B.Elbow 10 68 ?49 26.7         A.Elbow - Wrist             SNC    Nerve / Sites Rec. Site Peak Lat Ref.  Amp Ref. Segments Distance    ms ms V V  cm  R Radial - Anatomical snuff box (Forearm)     Forearm Wrist 2.2 ?2.9 27 ?15 Forearm - Wrist 10  R Median - Orthodromic (Dig II, Mid palm)     Dig II Wrist 2.9 ?3.4 28 ?10 Dig II - Wrist 13  R Ulnar - Orthodromic, (Dig V, Mid palm)      Dig V Wrist 2.3 ?3.1 11 ?5 Dig V - Wrist 13           F  Wave    Nerve F Lat Ref.   ms ms  R Ulnar - ADM 27.3 ?32.0       EMG Summary Table    Spontaneous MUAP Recruitment  Muscle IA Fib PSW Fasc Other Amp Dur. Poly Pattern  R. First dorsal interosseous Normal None None None _______ Normal Normal Normal Normal  R. Pronator teres Normal None None None _______ Normal Normal Normal Normal  R. Extensor digitorum communis Normal None None None _______ Normal Normal Normal Normal  R. Biceps brachii Normal None None None _______ Normal Normal Normal Normal  R. Deltoid Normal None None None _______ Normal Normal Normal Normal  R. Triceps brachii Normal None None None _______ Normal Normal Normal Normal  R. Cervical paraspinals Normal None None None _______ Normal Normal Normal Normal

## 2020-06-28 ENCOUNTER — Ambulatory Visit: Payer: Commercial Managed Care - PPO

## 2020-06-28 ENCOUNTER — Other Ambulatory Visit: Payer: Self-pay

## 2020-06-28 DIAGNOSIS — G43709 Chronic migraine without aura, not intractable, without status migrainosus: Secondary | ICD-10-CM

## 2020-06-28 DIAGNOSIS — M79644 Pain in right finger(s): Secondary | ICD-10-CM | POA: Diagnosis not present

## 2020-06-28 DIAGNOSIS — R202 Paresthesia of skin: Secondary | ICD-10-CM | POA: Diagnosis not present

## 2020-06-30 ENCOUNTER — Encounter: Payer: Self-pay | Admitting: Family Medicine

## 2020-07-03 ENCOUNTER — Other Ambulatory Visit: Payer: Self-pay

## 2020-07-03 ENCOUNTER — Encounter: Payer: Self-pay | Admitting: Family Medicine

## 2020-07-03 ENCOUNTER — Ambulatory Visit (INDEPENDENT_AMBULATORY_CARE_PROVIDER_SITE_OTHER): Payer: Commercial Managed Care - PPO | Admitting: Family Medicine

## 2020-07-03 VITALS — BP 126/84 | Temp 98.2°F | Ht 69.0 in | Wt 255.8 lb

## 2020-07-03 DIAGNOSIS — R200 Anesthesia of skin: Secondary | ICD-10-CM | POA: Diagnosis not present

## 2020-07-03 DIAGNOSIS — M778 Other enthesopathies, not elsewhere classified: Secondary | ICD-10-CM | POA: Diagnosis not present

## 2020-07-03 DIAGNOSIS — R202 Paresthesia of skin: Secondary | ICD-10-CM

## 2020-07-03 MED ORDER — AZITHROMYCIN 250 MG PO TABS: ORAL_TABLET | ORAL | 0 refills | Status: DC

## 2020-07-03 MED ORDER — DICLOFENAC SODIUM 75 MG PO TBEC: 75.0000 mg | DELAYED_RELEASE_TABLET | Freq: Two times a day (BID) | ORAL | 0 refills | Status: DC

## 2020-07-03 NOTE — Progress Notes (Signed)
   Subjective:    Patient ID: Dana Downs, female    DOB: 04-Jun-1981, 39 y.o.   MRN: [id number]  HPI  Patient arrives for a follow up on arm tingling- saw neurologist and had CT scan and is awaiting results.  Patient has been having tingling down the neck into the arm.  Also having some discomfort in the right thumb.  Denies any weakness in the arm.  States that it is difficult for her to squeeze with the right hand because of the tenderness in her thumb.  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  Patient also having scratchy throat, congestion, drainage and cough for 4-5 days this been going on for the past several days a little bit of sinus congestion pressure discomfort.  Patient had Covid approximately 5 weeks ago. Review of Systems Please see above.    Objective:   Physical Exam Patient has soreness in the right thumb along the flexor tendons.  Otherwise strength relatively good.  Nerve conduction study from neurology reviewed MRI of brain and cervical spine reviewed        Assessment & Plan:  Some tenderness more than likely tendinitis recommend anti-inflammatories over the next 2 weeks if not dramatically better in a week's time let us know and we will set up for orthopedics  Patient is to follow-up with neurology.  I am not sure what other tests can be done regarding her intermittent numbness into the right arm  Light duty work excuse given for this week and next week.

## 2020-07-11 ENCOUNTER — Ambulatory Visit: Payer: Commercial Managed Care - PPO | Admitting: Neurology

## 2020-07-14 ENCOUNTER — Other Ambulatory Visit: Payer: Self-pay | Admitting: Student

## 2020-07-14 DIAGNOSIS — G8929 Other chronic pain: Secondary | ICD-10-CM

## 2020-07-15 ENCOUNTER — Encounter: Payer: Self-pay | Admitting: Family Medicine

## 2020-08-10 ENCOUNTER — Other Ambulatory Visit: Payer: Self-pay

## 2020-08-10 ENCOUNTER — Ambulatory Visit
Admission: RE | Admit: 2020-08-10 | Discharge: 2020-08-10 | Disposition: A | Discharge status: Home / Self Care | Payer: Commercial Managed Care - PPO | Source: Ambulatory Visit | Attending: Student | Admitting: Student

## 2020-08-10 DIAGNOSIS — M545 Low back pain, unspecified: Secondary | ICD-10-CM

## 2020-08-10 DIAGNOSIS — G8929 Other chronic pain: Secondary | ICD-10-CM

## 2020-08-10 IMAGING — MR MR LUMBAR SPINE W/O CM
4 of 5 series · 23 of 48 positions shown · non-contrast
Comparison: None.

CLINICAL DATA: Low back pain.

EXAM:
MRI LUMBAR SPINE WITHOUT CONTRAST
TECHNIQUE: Multiplanar, multisequence MR imaging of the lumbar spine was
performed. No intravenous contrast was administered.

[Series 5: T2 · sagittal · 4.0mm · 0.73mm/px · 5 of 16 slices shown (1 of 2)]
[im 1/16]
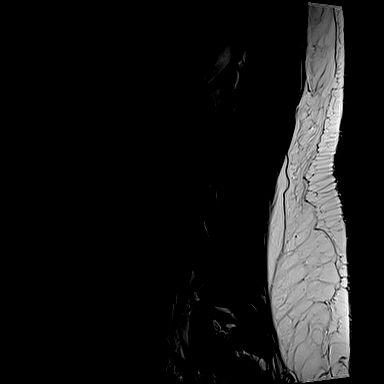
[im 4/16]
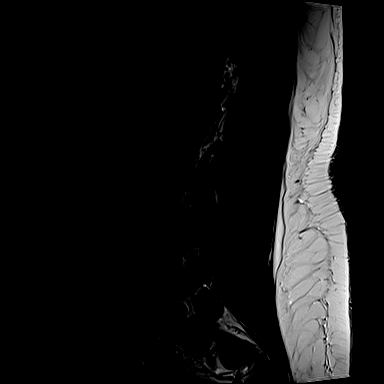
[im 8/16]
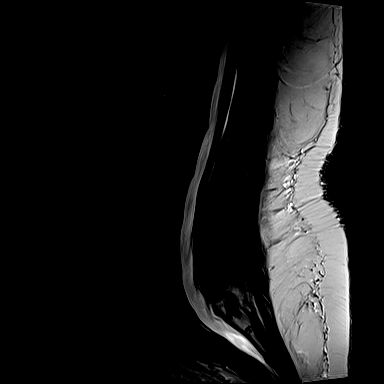
[im 12/16]
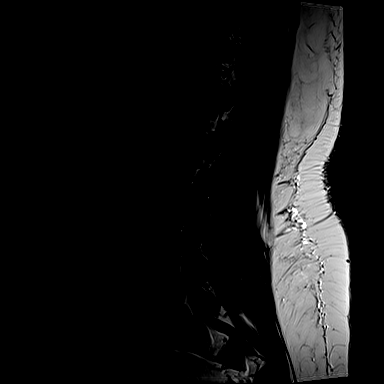
[im 16/16]
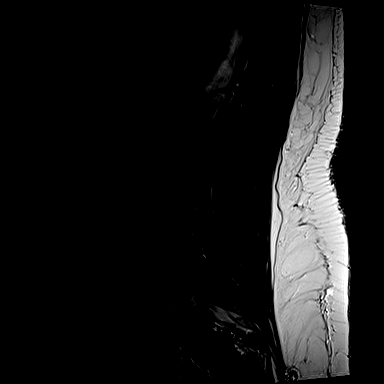

[Series 6: T1 · sagittal · 4.0mm · 0.88mm/px · 5 of 16 slices shown (1 of 2)]
[im 1/16]
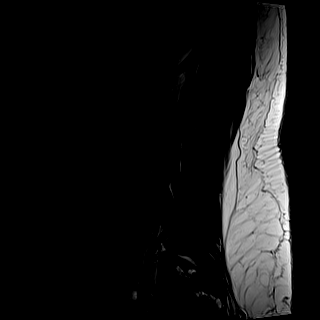
[im 4/16]
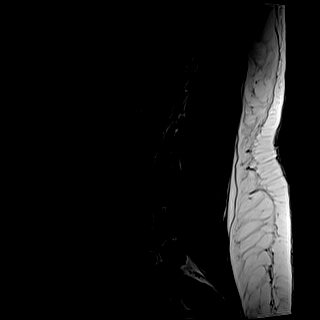
[im 8/16]
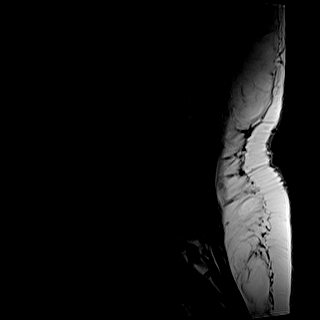
[im 12/16]
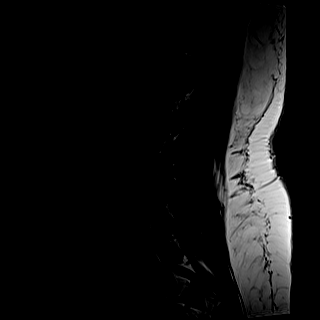
[im 16/16]
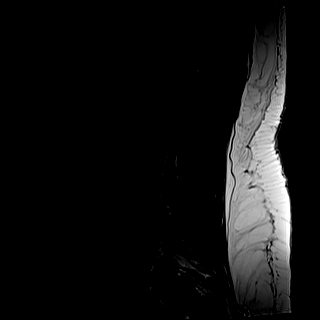

[Series 10: T1 · axial · 4.0mm · 0.28mm/px · z∈[-95,+82]mm · 3 of 44 slices shown (2 of 2)]
[im 6/44]
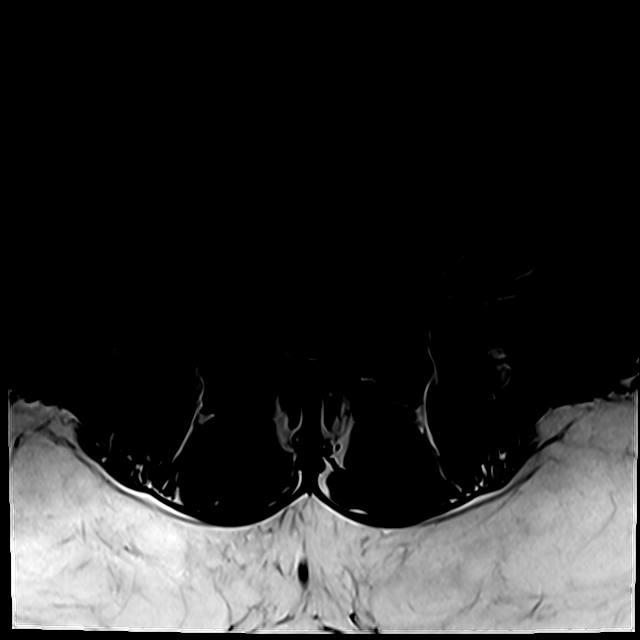
[im 23/44]
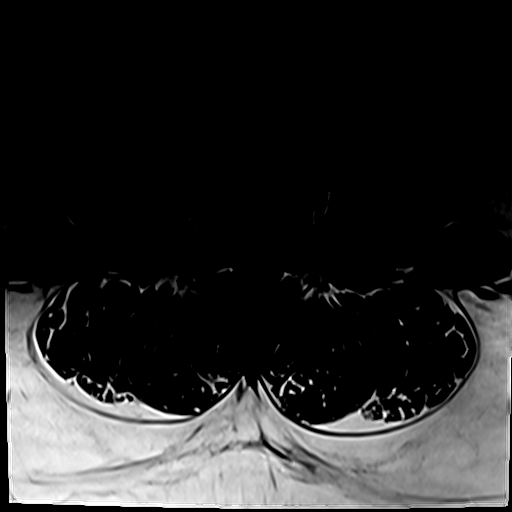
[im 38/44]
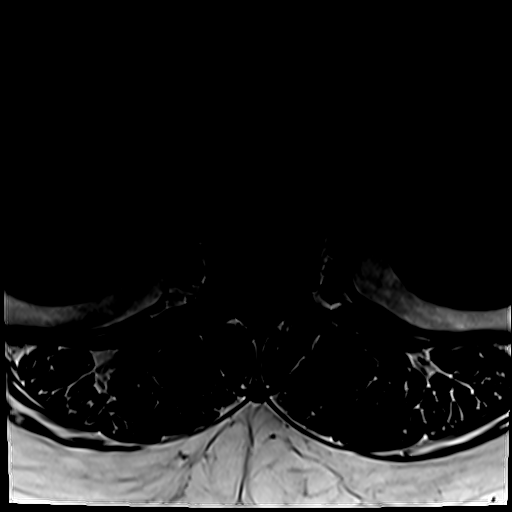

[Series 13: T2 · axial · 4.0mm · 0.35mm/px · z∈[-110,+111]mm · 10 of 44 slices shown (2 of 2)]
[im 3/44]
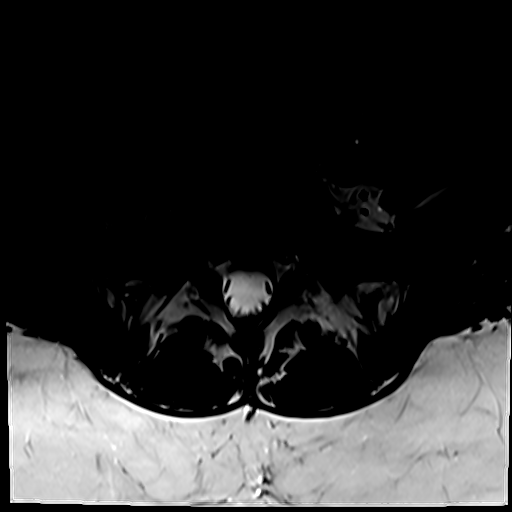
[im 6/44]
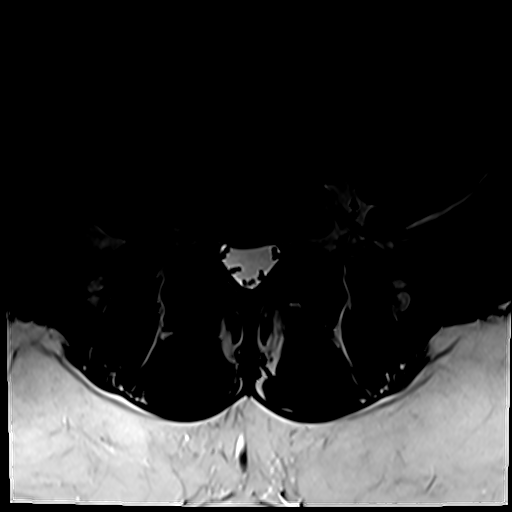
[im 9/44]
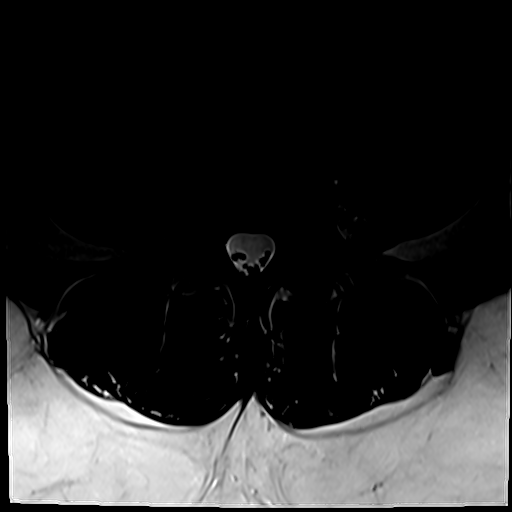
[im 15/44]
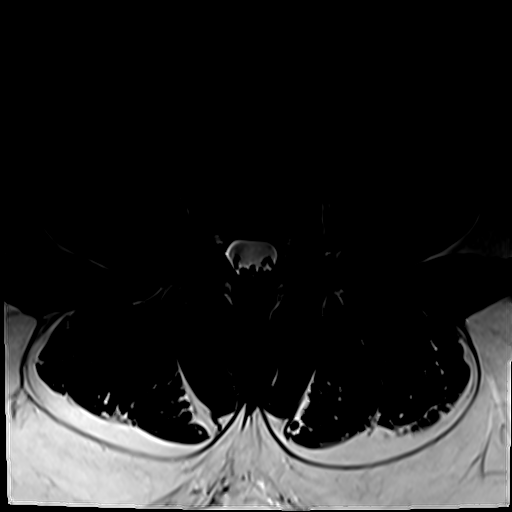
[im 21/44]
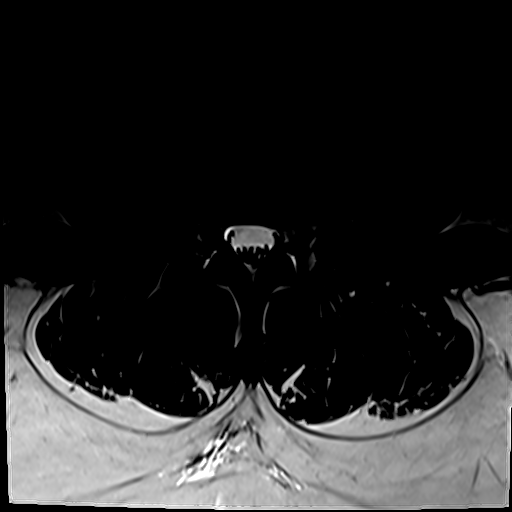
[im 23/44]
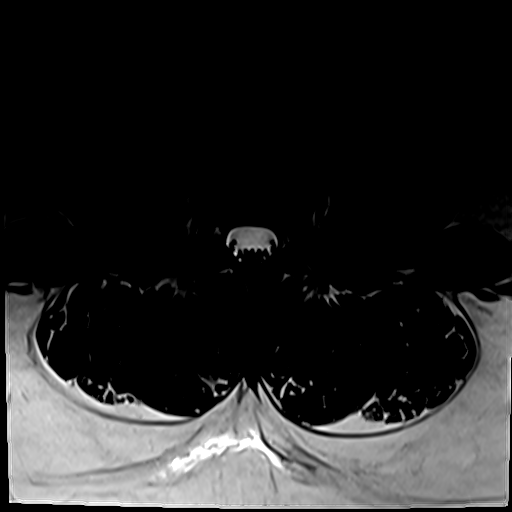
[im 26/44]
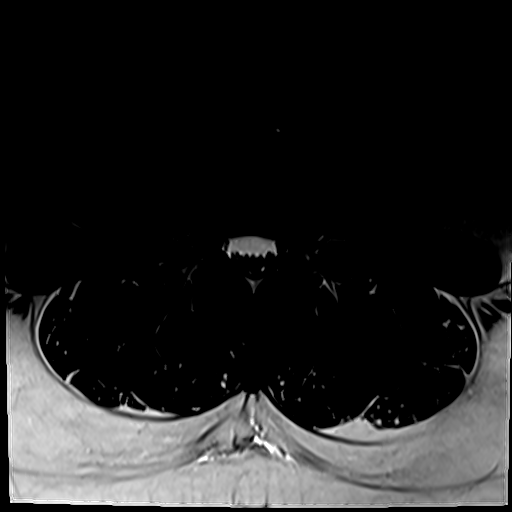
[im 32/44]
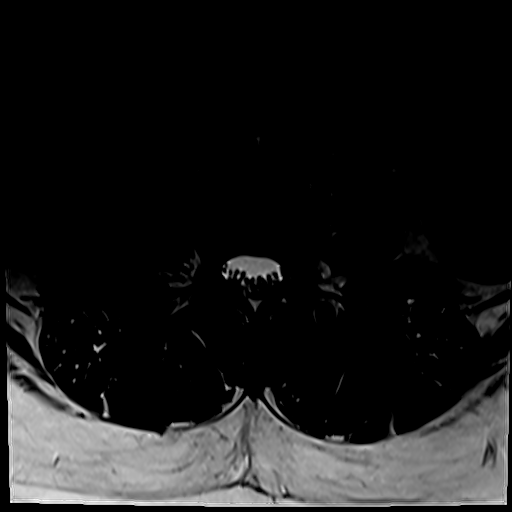
[im 38/44]
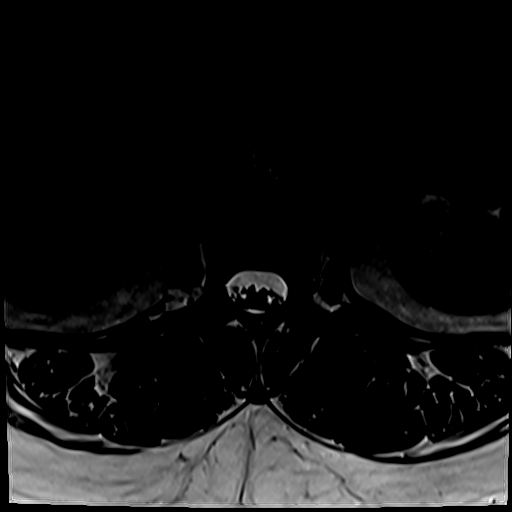
[im 44/44]
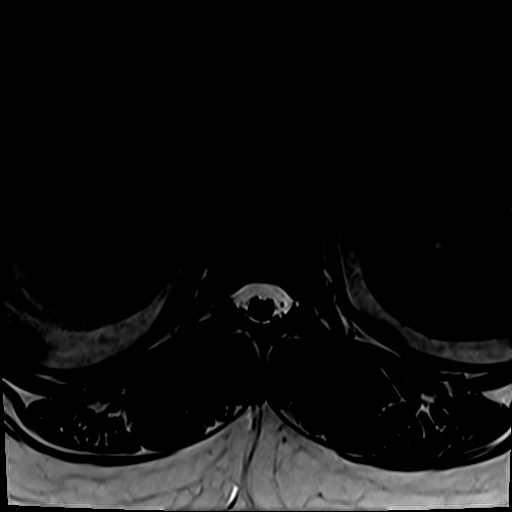

[23 of 48 positions shown; findings below may reference images not displayed]

FINDINGS: Segmentation:  Standard.

Alignment:  Physiologic.

Vertebrae:  No fracture, evidence of discitis, or bone lesion.

Conus medullaris and cauda equina: Conus extends to the L1-2 level.
Conus and cauda equina appear normal.

Paraspinal and other soft tissues: Negative.

Disc levels:

T12-L1: No spinal canal or neural foraminal stenosis.

L1-2: Tiny posterior disc protrusion without significant spinal
canal or neural foraminal stenosis.

L2-3: No spinal canal or neural foraminal stenosis.

L3-4: No spinal canal or neural foraminal stenosis.

L4-5: No spinal canal or neural foraminal stenosis.

L5-S1: Small central disc protrusion with associated annular tear
and mild facet degenerative changes without significant spinal canal
or neural foraminal stenosis.
IMPRESSION: Mild degenerative changes of the lumbar spine at L5-S1. No
significant spinal canal or neural foraminal stenosis at any level.

## 2020-09-13 ENCOUNTER — Ambulatory Visit: Payer: Self-pay

## 2020-09-13 ENCOUNTER — Other Ambulatory Visit: Payer: Self-pay

## 2020-09-13 ENCOUNTER — Encounter: Payer: Self-pay | Admitting: Orthopaedic Surgery

## 2020-09-13 ENCOUNTER — Ambulatory Visit (INDEPENDENT_AMBULATORY_CARE_PROVIDER_SITE_OTHER): Payer: Commercial Managed Care - PPO | Admitting: Orthopaedic Surgery

## 2020-09-13 VITALS — Ht 69.0 in | Wt 245.0 lb

## 2020-09-13 DIAGNOSIS — M25562 Pain in left knee: Secondary | ICD-10-CM | POA: Insufficient documentation

## 2020-09-13 DIAGNOSIS — G8929 Other chronic pain: Secondary | ICD-10-CM | POA: Diagnosis not present

## 2020-09-13 DIAGNOSIS — M25561 Pain in right knee: Secondary | ICD-10-CM

## 2020-09-13 MED ORDER — LIDOCAINE HCL 1 % IJ SOLN
2.0000 mL | INTRAMUSCULAR | Status: AC | PRN
  Administered 2020-09-13: 2 mL

## 2020-09-13 MED ORDER — BUPIVACAINE HCL 0.25 % IJ SOLN
2.0000 mL | INTRAMUSCULAR | Status: AC | PRN
  Administered 2020-09-13: 2 mL via INTRA_ARTICULAR

## 2020-09-13 NOTE — Progress Notes (Signed)
Office Visit Note   Patient: Dana Downs           Date of Birth: November 18, 1980           MRN: [id number] Visit Date: 09/13/2020              Requested by: Kathyrn Drown, MD [place],  [place] [address] PCP: Kathyrn Drown, MD   Assessment & Plan: Visit Diagnoses:  1. Chronic pain of both knees   2. Chronic pain of left knee     Plan: Bilateral knee pain predominantly symptomatic on the left side.  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  Films demonstrate lateral patella tilt more on the left asymptomatic side.  We will try a course of physical therapy and inject the lateral patella facet with betamethasone.  Follow-up as needed.  Discussed use of NSAIDs  Follow-Up Instructions: Return if symptoms worsen or fail to improve.   Orders:  Orders Placed This Encounter  Procedures  . Large Joint Inj: L knee  . XR KNEE 3 VIEW LEFT  . XR KNEE 3 VIEW RIGHT   No orders of the defined types were placed in this encounter.     Procedures: Large Joint Inj: L knee on 09/13/2020 1:44 PM Indications: pain and diagnostic evaluation Details: 25 G 1.5 in needle, lateral approach  Arthrogram: No  Medications: 2 mL lidocaine 1 %; 2 mL bupivacaine 0.25 %  12 mg betamethasone injected with Marcaine and Xylocaine beneath the lateral patella facet left knee Procedure, treatment alternatives, risks and benefits explained, specific risks discussed. Consent was given by the patient. Patient was prepped and draped in the usual sterile fashion.       Clinical Data: No additional findings.   Subjective: Chief Complaint  Patient presents with  . Left Knee - Pain  . Right Knee - Pain  Patient presents today for bilateral knee pain. She said that her left knee has hurt for years. She was last seen with Dr.Abed Schar 11years ago. She said that she was told she had a meniscal tear and would need surgery. At the time she could not do surgery. She states that  any flexion or stairs cause pain. Her knee locks up. She has swelling medially and pain laterally. She also is having right knee pain since a fall on concrete in 2009. She never had it examined, but it continues to hurt. She cannot kneel down on her knee due to pain. She takes Ibuprofen. 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  No obvious effusions.  Does have trouble with inclines, bending stooping or squatting.  Also has difficulty with stairs.  Most of her pain appears to be along the lateral patellar region  HPI  Review of Systems   Objective: Vital Signs: Ht 5\' 9"  (1.753 m)   Wt 245 lb (111.1 kg)   BMI 36.18 kg/m   Physical Exam Constitutional:      Appearance: She is well-developed and well-nourished.  HENT:     Mouth/Throat:     Mouth: Oropharynx is clear and moist.  Eyes:     Extraocular Movements: EOM normal.     Pupils: Pupils are equal, round, and reactive to light.  Pulmonary:     Effort: Pulmonary effort is normal.  Skin:    General: Skin is warm and dry.  Neurological:     Mental Status: She  is alert and oriented to person, place, and time.  Psychiatric:        Mood and Affect: Mood and affect normal.        Behavior: Behavior normal.     Ortho Exam left knee with some tenderness along the lateral patella had positive crepitation.  Some pain with patella compression.  No pain along the medial lateral joint.  Normal alignment.  There is some edema in the area of the pes anserine bursa but no tenderness.  Pes anserine bursa on the right knee is a little bit smaller and also asymptomatic.  Some patella crepitation on the right but little if any pain.  Full extension of flexed over 110 degrees without instability Specialty Comments:  No specialty comments available.  Imaging: XR KNEE 3 VIEW LEFT  Result Date: 09/13/2020 Films of the left knee were obtained in 3 projections standing.  There is  mild lateral patella tilt but no obvious osteophyte formation.  Normal alignment.  No ectopic calcification.  Medial lateral compartments appear to be.  No acute changes  XR KNEE 3 VIEW RIGHT  Result Date: 09/13/2020 Films of the right knee were obtained in several projections and are similar to the films of the left knee with the exception of the greater lateral tilt of the patella.  No obvious degenerative changes in the medial lateral compartment.  No ectopic calcification.  Normal alignment no acute change    PMFS History: Patient Active Problem List   Diagnosis Date Noted  . Pain in left knee 09/13/2020  . Chronic migraine w/o aura w/o status migrainosus, not intractable 06/08/2020  . Paresthesia 06/08/2020  . Thumb pain, right 06/08/2020  . Numbness of right thumb 06/01/2020  . Thyroid nodule 05/30/2016  . Constipation 07/04/2015  . Essential hypertension, benign 11/24/2012   Past Medical History:  Diagnosis Date  . Abnormal Pap smear   . Constipation, chronic   . Dysplasia   . GERD (gastroesophageal reflux disease)    heartburn with pregnancy-occasional  . Gestational diabetes   . Headache(784.0)   . HPV in female   . Numbness    right thumb  . Pins and needles sensation    right arm  . Polycystic ovarian syndrome     Family History  Problem Relation Age of Onset  . Hypertension Mother   . Hyperlipidemia Father   . Cancer Paternal Grandmother        breast  . Diabetes Paternal Grandmother     Past Surgical History:  Procedure Laterality Date  . CESAREAN SECTION  09/02/2011   Procedure: CESAREAN SECTION;  Surgeon: Marylynn Pearson, MD;  Location: [facility];  Service: Gynecology;  Laterality: N/A;  Primary cesarean section  . CHOLECYSTECTOMY  2013  . NECK SURGERY  age 31-gland removal   atypical microbacterial infection  . TOOTH EXTRACTION     Social History   Occupational History  . Occupation: Dectective  Tobacco Use  . Smoking status: Former Smoker    Quit  date: 09/18/2009    Years since quitting: 10.9  . Smokeless tobacco: Never Used  Substance and Sexual Activity  . Alcohol use: No  . Drug use: No  . Sexual activity: Not on file

## 2020-11-23 ENCOUNTER — Other Ambulatory Visit: Payer: Self-pay | Admitting: Neurosurgery

## 2020-11-23 DIAGNOSIS — M25511 Pain in right shoulder: Secondary | ICD-10-CM

## 2020-12-04 ENCOUNTER — Ambulatory Visit
Admission: RE | Admit: 2020-12-04 | Discharge: 2020-12-04 | Disposition: A | Discharge status: Home / Self Care | Payer: Commercial Managed Care - PPO | Source: Ambulatory Visit | Attending: Neurosurgery | Admitting: Neurosurgery

## 2020-12-04 ENCOUNTER — Other Ambulatory Visit: Payer: Self-pay

## 2020-12-04 DIAGNOSIS — M25511 Pain in right shoulder: Secondary | ICD-10-CM

## 2020-12-04 IMAGING — MR MR SHOULDER*R* W/O CM
4 of 5 series · 21 of 40 positions shown · non-contrast
Comparison: None.

CLINICAL DATA: Right shoulder pain.  No known injury

EXAM:
MRI OF THE RIGHT SHOULDER WITHOUT CONTRAST
TECHNIQUE: Multiplanar, multisequence MR imaging of the shoulder was performed.
No intravenous contrast was administered.

[Series 7: T2 fat-sat · axial · right · 3.0mm · 0.47mm/px · z∈[-48,+59]mm · 8 of 29 slices shown (1 of 3)]
[im 1/29]
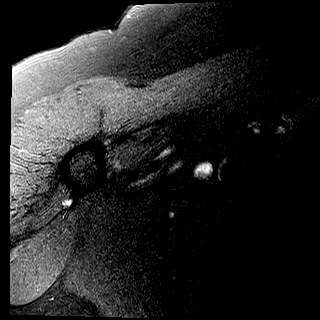
[im 4/29]
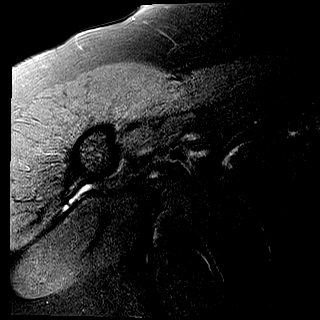
[im 10/29]
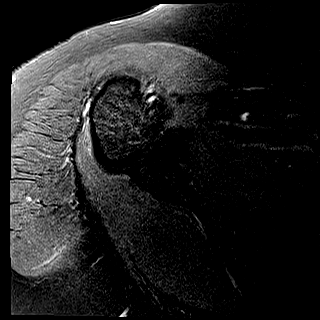
[im 13/29]
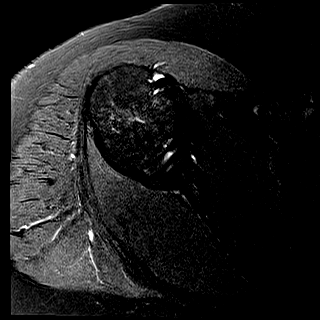
[im 16/29]
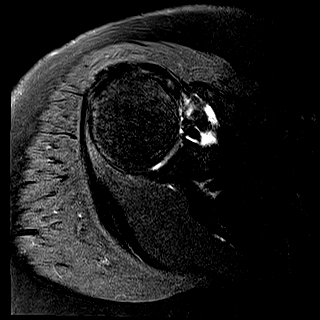
[im 19/29]
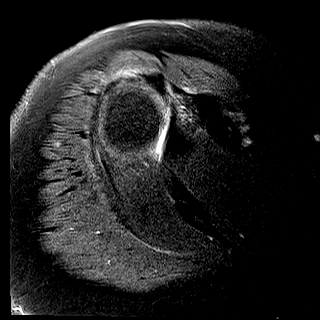
[im 25/29]
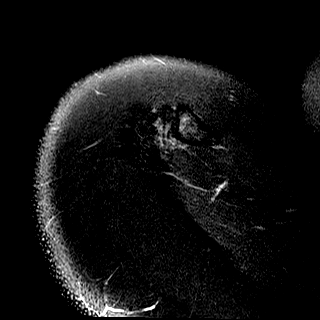
[im 29/29]
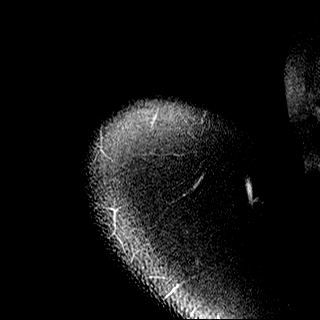

[Series 8: T2 fat-sat · oblique · right · 4.0mm · 0.22mm/px · 3 of 21 slices shown (2 of 3)]
[im 4/21]
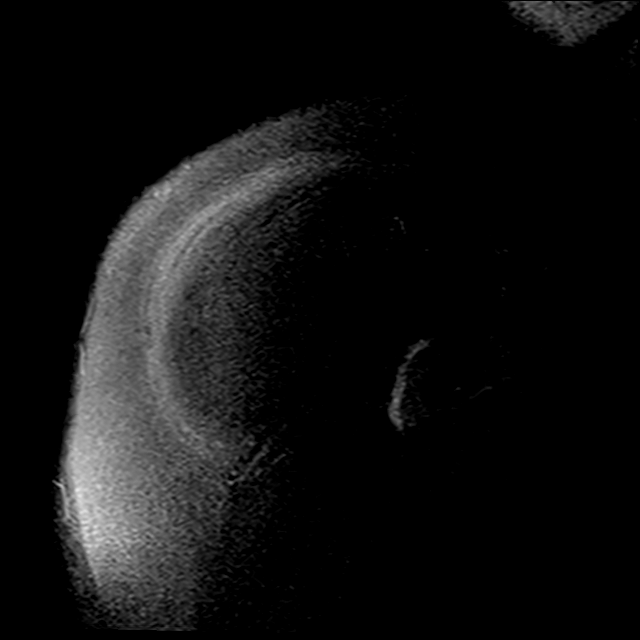
[im 11/21]
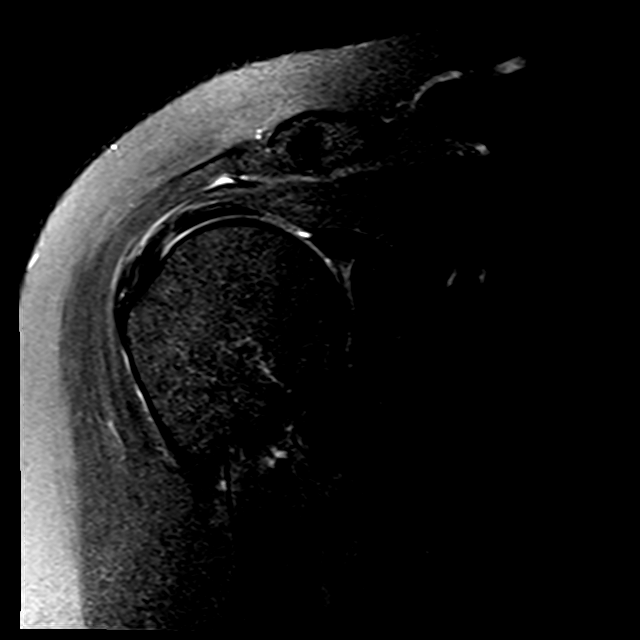
[im 17/21]
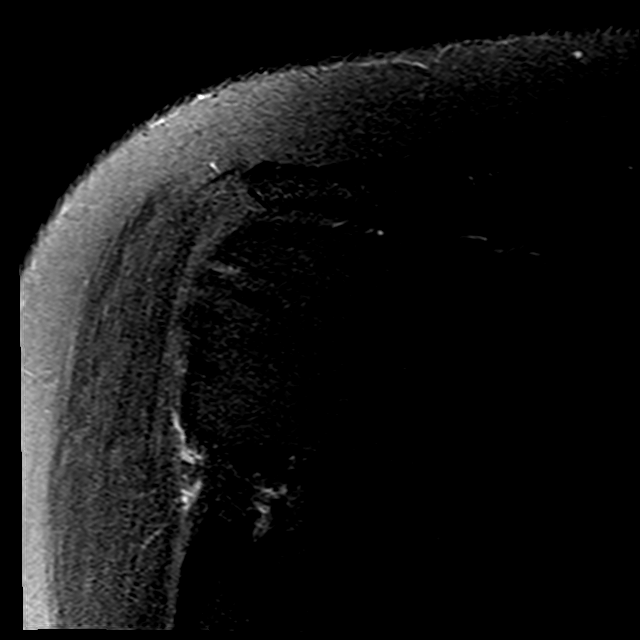

[Series 9: T2 fat-sat · oblique · right · 4.0mm · 0.44mm/px · 3 of 25 slices shown (3 of 3)]
[im 4/25]
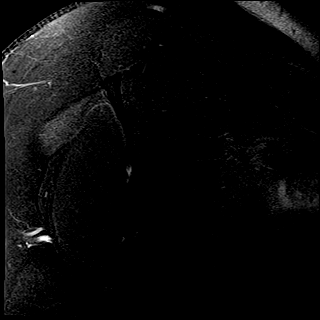
[im 14/25]
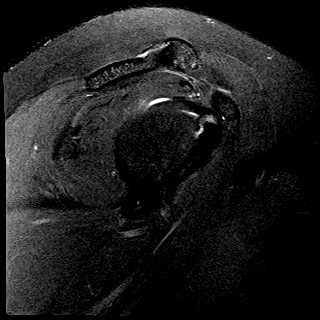
[im 21/25]
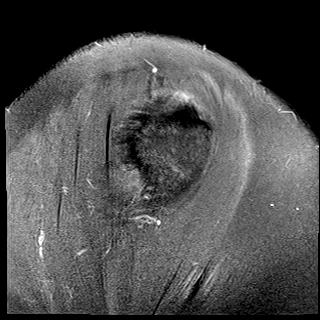

[Series 10: PD · oblique · right · 4.0mm · 0.22mm/px · 7 of 21 slices shown]
[im 1/21]
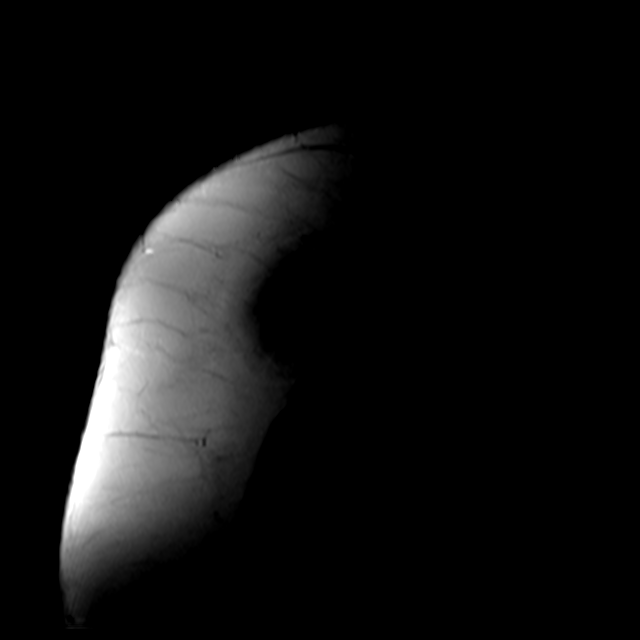
[im 4/21]
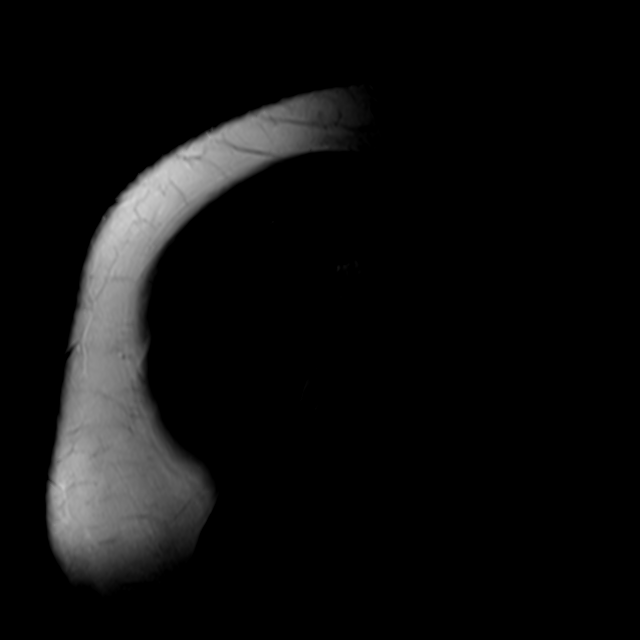
[im 7/21]
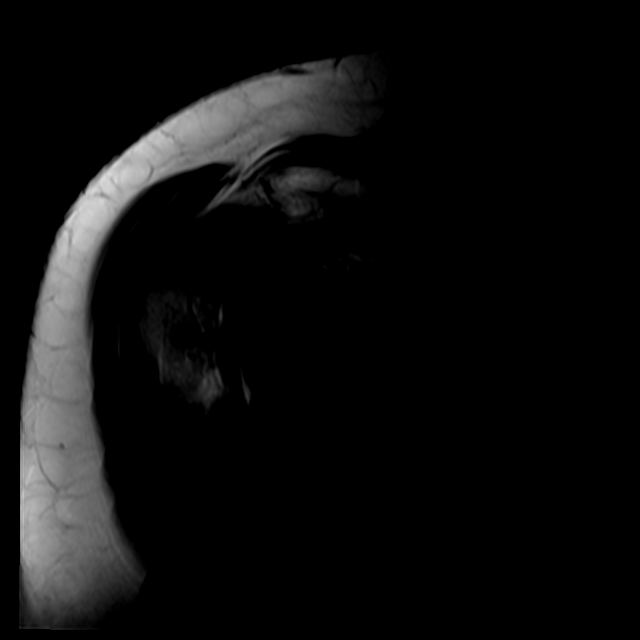
[im 11/21]
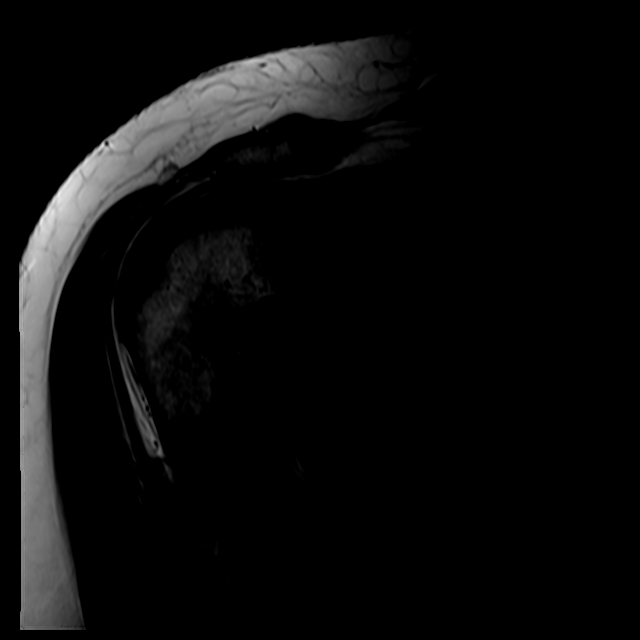
[im 14/21]
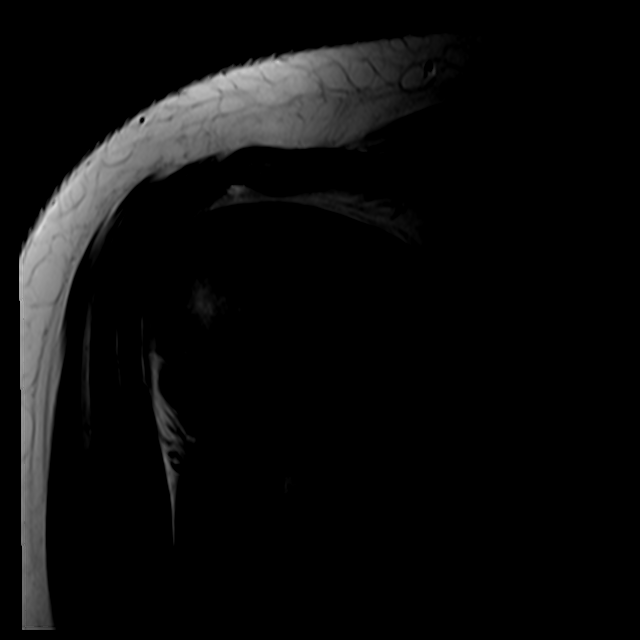
[im 17/21]
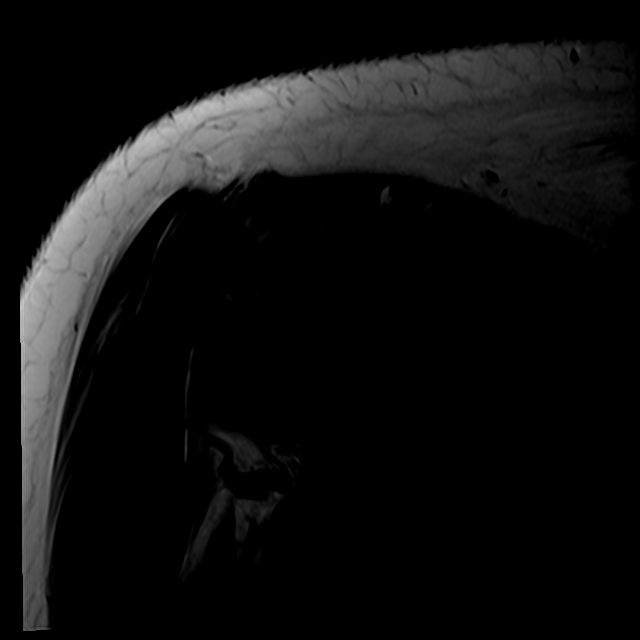
[im 21/21]
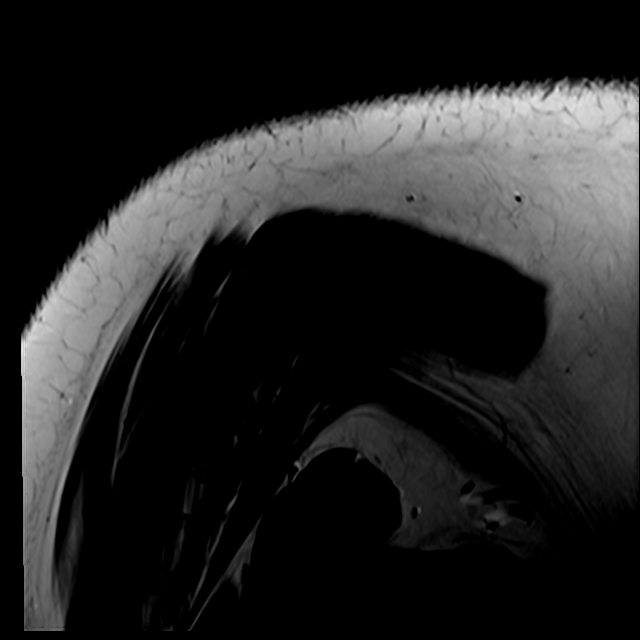

[21 of 40 positions shown; findings below may reference images not displayed]

FINDINGS: Rotator cuff: Mild supraspinatus tendinosis with low-grade bursal
sided fraying/irregularity. Infraspinatus, subscapularis, and teres
minor tendons are within normal limits. No full-thickness rotator
cuff tear.

Muscles: Preserved bulk and signal intensity of the rotator cuff
musculature without edema, atrophy, or fatty infiltration.

Biceps long head:  Intact.

Acromioclavicular Joint: Minimal arthropathy of the AC joint. No
subacromial-subdeltoid bursal fluid.

Glenohumeral Joint: No joint effusion. No chondral defect.

Labrum: Grossly intact, but evaluation is limited by lack of
intraarticular fluid. No paralabral cyst.

Bones:  No marrow abnormality, fracture or dislocation.

Other: None.
IMPRESSION: 1. Mild supraspinatus tendinosis with low-grade bursal sided
fraying/irregularity. No full-thickness rotator cuff tear.
2. Minimal arthropathy of the AC joint.

## 2021-05-02 LAB — OB RESULTS CONSOLE ABO/RH: RH Type: POSITIVE

## 2021-05-02 LAB — OB RESULTS CONSOLE HIV ANTIBODY (ROUTINE TESTING): HIV: NONREACTIVE

## 2021-05-02 LAB — HEPATITIS C ANTIBODY: HCV Ab: NEGATIVE

## 2021-05-02 LAB — OB RESULTS CONSOLE HEPATITIS B SURFACE ANTIGEN: Hepatitis B Surface Ag: NEGATIVE

## 2021-05-02 LAB — OB RESULTS CONSOLE ANTIBODY SCREEN: Antibody Screen: NEGATIVE

## 2021-05-02 LAB — OB RESULTS CONSOLE RUBELLA ANTIBODY, IGM: Rubella: IMMUNE

## 2021-05-02 LAB — OB RESULTS CONSOLE RPR: RPR: NONREACTIVE

## 2021-05-14 ENCOUNTER — Telehealth: Payer: Self-pay | Admitting: Family Medicine

## 2021-05-14 NOTE — Telephone Encounter (Signed)
Patient has physical on 10/28 and needing labs done

## 2021-05-15 LAB — OB RESULTS CONSOLE GC/CHLAMYDIA
Chlamydia: NEGATIVE
Gonorrhea: NEGATIVE

## 2021-05-16 ENCOUNTER — Other Ambulatory Visit: Payer: Self-pay

## 2021-05-16 DIAGNOSIS — Z1159 Encounter for screening for other viral diseases: Secondary | ICD-10-CM

## 2021-05-16 DIAGNOSIS — I10 Essential (primary) hypertension: Secondary | ICD-10-CM

## 2021-05-16 DIAGNOSIS — Z1329 Encounter for screening for other suspected endocrine disorder: Secondary | ICD-10-CM

## 2021-05-16 DIAGNOSIS — Z0000 Encounter for general adult medical examination without abnormal findings: Secondary | ICD-10-CM

## 2021-05-16 DIAGNOSIS — Z13220 Encounter for screening for lipoid disorders: Secondary | ICD-10-CM

## 2021-05-16 NOTE — Telephone Encounter (Signed)
Pearson Forster C, NP   Please order CBC, CMP, Lipid, TSH and Hepatitis C screening. Thanks.

## 2021-05-24 ENCOUNTER — Telehealth: Payer: BC Managed Care – PPO | Admitting: Physician Assistant

## 2021-05-24 DIAGNOSIS — J069 Acute upper respiratory infection, unspecified: Secondary | ICD-10-CM

## 2021-05-24 NOTE — Patient Instructions (Signed)
  Dana Downs, thank you for joining Leeanne Rio, PA-C for today's virtual visit.  While this provider is not your primary care provider (PCP), if your PCP is located in our provider database this encounter information will be shared with them immediately following your visit.  Consent: (Patient) Dana Downs provided verbal consent for this virtual visit at the beginning of the encounter.  Current Medications:  Current Outpatient Medications:    ALPRAZolam (XANAX) 1 MG tablet, Take 1-2 tablets thirty minutes prior to MRI.  May take one additional tablet before entering scanner, if needed.  MUST HAVE DRIVER., Disp: 3 tablet, Rfl: 0   azithromycin (ZITHROMAX Z-PAK) 250 MG tablet, Take 2 tablets (500 mg) on  Day 1,  followed by 1 tablet (250 mg) once daily on Days 2 through 5., Disp: 6 each, Rfl: 0   diclofenac (VOLTAREN) 75 MG EC tablet, Take 1 tablet (75 mg total) by mouth 2 (two) times daily., Disp: 30 tablet, Rfl: 0   Diclofenac Sodium 1 % CREA, 4 gram qid prn, Disp: 120 g, Rfl: 11   LINZESS 145 MCG CAPS capsule, TAKE 1 CAPSULE BY MOUTH  DAILY BEFORE BREAKFAST, Disp: 90 capsule, Rfl: 1   meclizine (ANTIVERT) 25 MG tablet, Take 1 tablet (25 mg total) by mouth 3 (three) times daily as needed for dizziness., Disp: 30 tablet, Rfl: 0   SUMAtriptan (IMITREX) 50 MG tablet, Take 1 tablet (50 mg total) by mouth every 2 (two) hours as needed for migraine. May repeat in 2 hours if headache persists or recurs., Disp: 12 tablet, Rfl: 6   Medications ordered in this encounter:  No orders of the defined types were placed in this encounter.    *If you need refills on other medications prior to your next appointment, please contact your pharmacy*  Follow-Up: Call back or seek an in-person evaluation if the symptoms worsen or if the condition fails to improve as anticipated.  Other Instructions Please keep well-hydrated and get plenty of rest. Run a humidifier in the bedroom at night if you  have one. Salt-water gargles and chloraseptic spray can be beneficial. You can use an OTC antihistamine like Claritin or Benadryl. Tylenol for throat pain. I recommend starting a saline nasal rinse before bed as well.  If symptoms are not improving/resolving, or if you note any new or worsening symptoms, please let us, your PCP or your OB know ASAP.  If you have been instructed to have an in-person evaluation today at a local Urgent Care facility, please use the link below. It will take you to a list of all of our available [facility], including address, phone number and hours of operation. Please do not delay care.  [facility]  If you or a family member do not have a primary care provider, use the link below to schedule a visit and establish care. When you choose a [facility] primary care physician or advanced practice provider, you gain a long-term partner in health. Find a Primary Care Provider  Learn more about 's in-office and virtual care options: [facility]

## 2021-05-24 NOTE — Progress Notes (Signed)
Virtual Visit Consent   [place] JON, you are scheduled for a virtual visit with a [facility] provider today.     Just as with appointments in the office, your consent must be obtained to participate.  Your consent will be active for this visit and any virtual visit you may have with one of our providers in the next 365 days.     If you have a MyChart account, a copy of this consent can be sent to you electronically.  All virtual visits are billed to your insurance company just like a traditional visit in the office.    As this is a virtual visit, video technology does not allow for your provider to perform a traditional examination.  This may limit your provider's ability to fully assess your condition.  If your provider identifies any concerns that need to be evaluated in person or the need to arrange testing (such as labs, EKG, etc.), we will make arrangements to do so.     Although advances in technology are sophisticated, we cannot ensure that it will always work on either your end or our end.  If the connection with a video visit is poor, the visit may have to be switched to a telephone visit.  With either a video or telephone visit, we are not always able to ensure that we have a secure connection.     I need to obtain your verbal consent now.   Are you willing to proceed with your visit today?    Dana Downs has provided verbal consent on 05/24/2021 for a virtual visit (video or telephone).   Leeanne Rio, [place]   Date: 05/24/2021 9:22 AM   Virtual Visit via Video Note   I, Leeanne Rio, connected with  Dana Downs  ([id number], 03-23-1981) on 05/24/21 at  9:30 AM EDT by a video-enabled telemedicine application and verified that I am speaking with the correct person using two identifiers.  Location: Patient: Virtual Visit Location Patient: Home Provider: Virtual Visit Location Provider: Home Office   I discussed the limitations of evaluation and management  by telemedicine and the availability of in person appointments. The patient expressed understanding and agreed to proceed.    History of Present Illness: Dana Downs is a 40 y.o. who identifies as a female who was assigned female at birth, and is being seen today for URI symptoms. Is currently ~ [redacted] weeks pregnant. Patient endorses sore throat x 1.5 days. Notes a dry, barky cough with this. Denies fever, chills, aches.  Denies nasal congestion or PND. Denies heartburn, nausea or vomiting. Denies recent travel. Daughter (57) was sick over the weekend and viral URI. Has history of chronic tonsillar swelling but notes redness and swelling above her baseline. Has taken Tylenol OTC.   HPI: HPI  Problems:  Patient Active Problem List   Diagnosis Date Noted   Pain in left knee 09/13/2020   Chronic migraine w/o aura w/o status migrainosus, not intractable 06/08/2020   Paresthesia 06/08/2020   Thumb pain, right 06/08/2020   Numbness of right thumb 06/01/2020   Thyroid nodule 05/30/2016   Constipation 07/04/2015   Essential hypertension, benign 11/24/2012    Allergies:  Allergies  Allergen Reactions   Penicillins Anaphylaxis   Acyclovir And Related    Doxycycline Hives   Sulfa Antibiotics Hives   Sulphur [Sulfur Sublimed] Hives   Medications:  Current Outpatient Medications:    diclofenac (VOLTAREN) 75 MG EC tablet, Take 1 tablet (  75 mg total) by mouth 2 (two) times daily., Disp: 30 tablet, Rfl: 0   Diclofenac Sodium 1 % CREA, 4 gram qid prn, Disp: 120 g, Rfl: 11   meclizine (ANTIVERT) 25 MG tablet, Take 1 tablet (25 mg total) by mouth 3 (three) times daily as needed for dizziness., Disp: 30 tablet, Rfl: 0  Observations/Objective: Patient is well-developed, well-nourished in no acute distress.  Resting comfortably at home.  Head is normocephalic, atraumatic.  No labored breathing. Speech is clear and coherent with logical content.  Patient is alert and oriented at baseline.    Assessment and Plan: 1. Viral URI with cough Afebrile. No alarm signs/symptoms present. Seems mild viral pharyngitis/UIRI with dry cough, likely contracted from her daughter with similar symptoms this past weekend. Reassurance given. Supportive measures and OTC medications reviewed. No indication for COVID testing at present but if she develops any chest congestion, windedness or fever, she is to be tested to be cautious giving pregnancy status.   Follow Up Instructions: I discussed the assessment and treatment plan with the patient. The patient was provided an opportunity to ask questions and all were answered. The patient agreed with the plan and demonstrated an understanding of the instructions.  A copy of instructions were sent to the patient via MyChart unless otherwise noted below.   The patient was advised to call back or seek an in-person evaluation if the symptoms worsen or if the condition fails to improve as anticipated.  Time:  I spent 10 minutes with the patient via telehealth technology discussing the above problems/concerns.    Leeanne Rio, PA-C

## 2021-05-29 ENCOUNTER — Encounter: Payer: Self-pay | Admitting: Family Medicine

## 2021-05-29 ENCOUNTER — Other Ambulatory Visit: Payer: Self-pay

## 2021-05-29 ENCOUNTER — Ambulatory Visit (INDEPENDENT_AMBULATORY_CARE_PROVIDER_SITE_OTHER): Payer: BC Managed Care – PPO | Admitting: Family Medicine

## 2021-05-29 VITALS — Temp 98.4°F | Wt 240.8 lb

## 2021-05-29 DIAGNOSIS — R059 Cough, unspecified: Secondary | ICD-10-CM

## 2021-05-29 NOTE — Progress Notes (Signed)
   Subjective:    Patient ID: Dana Downs, female    DOB: August 21, 1980, 40 y.o.   MRN: [id number]  HPI Pt having cough, sore throat, congestion, right ear pain and mucus. Negative COVID test. Has been taking Tylenol Severe Cold. Pt is currently [redacted] weeks pregnant.  Patient relates some congestion sore throat ear pressure pain and some pain with swallowing on the right side denies wheezing difficulty breathing.  Has had a lot of coughing and congestion.  Negative home COVID test.  Review of Systems     Objective:   Physical Exam  Eardrums are normal throat is normal neck no masses subjective tenderness in the lymph glands cervical lymph glands lungs are clear no crackles no respiratory distress heart regular      Assessment & Plan:  Viral syndrome Triple swab taken COVID test pending as well as RSV and flu.  Supportive measures discussed.  Tylenol as needed.  Blood pressure borderline at 138/78 recommend to stay away from decongestants stick with Robitussin-DM or Delsym May use Tylenol as needed for discomfort patient is to give [place] feedback later in the week how she is doing

## 2021-05-29 NOTE — Telephone Encounter (Signed)
Plz set up ov for tuesday

## 2021-05-30 ENCOUNTER — Telehealth: Payer: Self-pay | Admitting: Family Medicine

## 2021-05-30 NOTE — Telephone Encounter (Signed)
Pt sent form via my chart to be completed. Form in provider office for review and signature. Please advise. Thank you

## 2021-05-31 LAB — SPECIMEN STATUS REPORT

## 2021-05-31 LAB — COVID-19, FLU A+B AND RSV
Influenza A, NAA: NOT DETECTED
Influenza B, NAA: NOT DETECTED
RSV, NAA: NOT DETECTED
SARS-CoV-2, NAA: NOT DETECTED

## 2021-06-01 ENCOUNTER — Encounter: Payer: BC Managed Care – PPO | Admitting: Nurse Practitioner

## 2021-06-03 NOTE — Telephone Encounter (Signed)
Nurses-please touch base with patient her health examination certification I am perfectly fine with indicating no limitations but in regards to lifting and carrying with her being pregnant I would hope that she limits it to 20 pounds or less during pregnancy  Please see with the patient does she want [place] to write that on the health examination certificate for will she try to do her best to follow that thank you

## 2021-06-04 NOTE — Telephone Encounter (Signed)
Pt contacted. Pt would like [place] to place on the form to limit lifting to 20 pounds or less. Please advise. Thank you

## 2021-06-04 NOTE — Telephone Encounter (Signed)
Form completed. Left message to inform pt form was ready. Form up front for pickup

## 2021-06-05 NOTE — Telephone Encounter (Signed)
Pt replied via my chart and husband will come by to pick up form

## 2021-07-07 ENCOUNTER — Encounter (HOSPITAL_COMMUNITY): Payer: Self-pay | Admitting: Obstetrics & Gynecology

## 2021-07-07 ENCOUNTER — Inpatient Hospital Stay (HOSPITAL_COMMUNITY)
Admission: AD | Admit: 2021-07-07 | Discharge: 2021-07-07 | Disposition: A | Discharge status: Home / Self Care | Payer: BC Managed Care – PPO | Source: Ambulatory Visit | Attending: Obstetrics & Gynecology | Admitting: Obstetrics & Gynecology

## 2021-07-07 ENCOUNTER — Other Ambulatory Visit: Payer: Self-pay

## 2021-07-07 DIAGNOSIS — O99612 Diseases of the digestive system complicating pregnancy, second trimester: Secondary | ICD-10-CM | POA: Insufficient documentation

## 2021-07-07 DIAGNOSIS — K6289 Other specified diseases of anus and rectum: Secondary | ICD-10-CM | POA: Insufficient documentation

## 2021-07-07 DIAGNOSIS — Z3A18 18 weeks gestation of pregnancy: Secondary | ICD-10-CM

## 2021-07-07 DIAGNOSIS — Z87891 Personal history of nicotine dependence: Secondary | ICD-10-CM | POA: Insufficient documentation

## 2021-07-07 DIAGNOSIS — K5900 Constipation, unspecified: Secondary | ICD-10-CM | POA: Diagnosis not present

## 2021-07-07 DIAGNOSIS — R140 Abdominal distension (gaseous): Secondary | ICD-10-CM | POA: Insufficient documentation

## 2021-07-07 DIAGNOSIS — Z3492 Encounter for supervision of normal pregnancy, unspecified, second trimester: Secondary | ICD-10-CM

## 2021-07-07 DIAGNOSIS — O26892 Other specified pregnancy related conditions, second trimester: Secondary | ICD-10-CM | POA: Insufficient documentation

## 2021-07-07 MED ORDER — MAGNESIUM OXIDE -MG SUPPLEMENT 200 MG PO TABS: 400.0000 mg | ORAL_TABLET | Freq: Every day | ORAL | 3 refills | Status: DC

## 2021-07-07 MED ORDER — ACETAMINOPHEN 500 MG PO TABS
1000.0000 mg | ORAL_TABLET | Freq: Once | ORAL | Status: AC
  Administered 2021-07-07: 1000 mg via ORAL
  Filled 2021-07-07: qty 2

## 2021-07-07 MED ORDER — CYCLOBENZAPRINE HCL 5 MG PO TABS
10.0000 mg | ORAL_TABLET | Freq: Once | ORAL | Status: AC
  Administered 2021-07-07: 10 mg via ORAL
  Filled 2021-07-07: qty 2

## 2021-07-07 NOTE — MAU Note (Signed)
Presents with c/o severe constipation.  States has hx of IBS with constipation.  Reports has taking Miralax & stool softeners regularly but also did a mineral oil enema without results. No BM since Wednesday.  Reports having difficulty voiding, has pelvic pressure. Denies VB or LOF.

## 2021-07-07 NOTE — MAU Provider Note (Addendum)
Chief Complaint:  Constipation   None    HPI: Dana Downs is a 40 y.o. G2P1001 at [redacted]w[redacted]d who presents to maternity admissions reporting severe constipation since Wednesday. Has a history of chronic constipation (since age 89) and an anal fissure. Has taken several doses of Miralax, stool softeners and tried a mineral oil enema (6hrs ago). In the past, these have not worked, only daily Linzess then Metformin helped keep her regular. Constipation has caused severe rectal pain and abdominal bloating but no abdominal pain or cramping. Also reports a headache, she thinks because she has barely eaten all day. Denies vaginal bleeding, leaking of fluid, fever, falls, or recent illness.   Pregnancy Course: Uncomplicated, [facility] care at [facility].  Past Medical History:  Diagnosis Date   Abnormal Pap smear    Constipation, chronic    Dysplasia    GERD (gastroesophageal reflux disease)    heartburn with pregnancy-occasional   Gestational diabetes    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    Numbness    right thumb   Pins and needles sensation    right arm   Polycystic ovarian syndrome    OB History  Gravida Para Term Preterm AB Living  2 1 1     1   SAB IAB Ectopic Multiple Live Births          1    # Outcome Date GA Lbr Len/2nd Weight Sex Delivery Anes PTL Lv  2 Current           1 Term 09/02/11 [redacted]w[redacted]d  7 lb 7.6 oz (3.39 kg) F CS-LTranv Spinal  LIV   Past Surgical History:  Procedure Laterality Date   CESAREAN SECTION  09/02/2011   Procedure: CESAREAN SECTION;  Surgeon: Marylynn Pearson, MD;  Location: [facility];  Service: Gynecology;  Laterality: N/A;  Primary cesarean section   CHOLECYSTECTOMY  2013   NECK SURGERY  age 47-gland removal   atypical microbacterial infection   TOOTH EXTRACTION     Family History  Problem Relation Age of Onset   Hypertension Mother    Hyperlipidemia Father    Cancer Paternal Grandmother        breast   Diabetes Paternal Grandmother    Social  History   Tobacco Use   Smoking status: Former    Types: Cigarettes    Quit date: 09/18/2009    Years since quitting: 11.8   Smokeless tobacco: Never  Substance Use Topics   Alcohol use: No   Drug use: No   Allergies  Allergen Reactions   Penicillins Anaphylaxis   Acyclovir And Related    Doxycycline Hives   Sulfa Antibiotics Hives   Sulphur [Sulfur Sublimed] Hives   Medications Prior to Admission  Medication Sig Dispense Refill Last Dose   docusate sodium (COLACE) 100 MG capsule Take by mouth 2 (two) times daily.      polyethylene glycol (MIRALAX / GLYCOLAX) 17 g packet Take 17 g by mouth daily.      Prenatal Vit-Fe Fumarate-FA (MULTIVITAMIN-PRENATAL) 27-0.8 MG TABS tablet Take 1 tablet by mouth daily at 12 noon.      diclofenac (VOLTAREN) 75 MG EC tablet Take 1 tablet (75 mg total) by mouth 2 (two) times daily. (Patient not taking: Reported on 05/29/2021) 30 tablet 0    Diclofenac Sodium 1 % CREA 4 gram qid prn (Patient not taking: Reported on 05/29/2021) 120 g 11    meclizine (ANTIVERT) 25 MG tablet Take 1 tablet (25 mg  total) by mouth 3 (three) times daily as needed for dizziness. 30 tablet 0    I have reviewed patient's Past Medical Hx, Surgical Hx, Family Hx, Social Hx, medications and allergies.   ROS:  Review of Systems  Constitutional:  Negative for fatigue and fever.  HENT:  Negative for congestion and sore throat.   Eyes:  Negative for photophobia and visual disturbance.  Gastrointestinal:  Positive for abdominal distention, constipation and rectal pain. Negative for diarrhea, nausea and vomiting.  Genitourinary:  Negative for pelvic pain and vaginal bleeding.  Musculoskeletal:  Negative for back pain.  Neurological:  Positive for headaches. Negative for dizziness, syncope and light-headedness.   Physical Exam  Patient Vitals for the past 24 hrs:  BP Temp Temp src Pulse Resp SpO2 Height Weight  07/07/21 1804 124/75 98 F (36.7 C) Oral 98 18 100 % -- --   07/07/21 1758 -- -- -- -- -- -- 5\' 9"  (1.753 m) 241 lb 4.8 oz (109.5 kg)   Constitutional: Well-developed, well-nourished female sitting on one cheek to because it is so uncomfortable to sit straight on her bottom Cardiovascular: normal rate & rhythm Respiratory: normal effort GI: Abd soft but distended, non-tender, gravid appropriate for gestational age. Pos BS x 4 MS: Extremities nontender, no edema, normal ROM Neurologic: Alert and oriented x 4.  GU: no CVA tenderness Pelvic: exam deferred  FHR: 156   Labs: No results found for this or any previous visit (from the past 24 hour(s)).  Imaging:  No results found.  MAU Course: Orders Placed This Encounter  Procedures   Soap suds enema   Meds ordered this encounter  Medications   cyclobenzaprine (FLEXERIL) tablet 10 mg   acetaminophen (TYLENOL) tablet 1,000 mg   MDM: Flexeril (husband drove her) and soap suds enema with Dr. Brynda Greathouse slushie enema instructions ordered. Also ordered Tylenol for her headache. Discussed possibly using magnesium at night to help with regularity as she has never tried it in the past.  Care turned over to [facility], CNM at 2030.  [name], CNM, MSN, [place] Certified Nurse Midwife, [facility]    Assessment: No diagnosis found.  Plan:   Reassessment (9:34 PM) -Nurse reports patient with large sized stool and relief. -Discharge instructions and medications placed by [name], CNM -Patient to follow up as scheduled. -Return precautions to be provided by nurse. -Discharged to home in stable condition.  [name] MSN, CNM Advanced Practice Provider, [facility]

## 2021-07-07 NOTE — MAU Note (Signed)
Soap suds enema administered per instruction.

## 2021-08-21 ENCOUNTER — Inpatient Hospital Stay (HOSPITAL_COMMUNITY)
Admission: AD | Admit: 2021-08-21 | Discharge: 2021-08-22 | Disposition: A | Discharge status: Home / Self Care | Payer: BC Managed Care – PPO | Attending: Obstetrics and Gynecology | Admitting: Obstetrics and Gynecology

## 2021-08-21 ENCOUNTER — Other Ambulatory Visit: Payer: Self-pay

## 2021-08-21 DIAGNOSIS — K6289 Other specified diseases of anus and rectum: Secondary | ICD-10-CM | POA: Insufficient documentation

## 2021-08-21 DIAGNOSIS — O99891 Other specified diseases and conditions complicating pregnancy: Secondary | ICD-10-CM | POA: Diagnosis not present

## 2021-08-21 DIAGNOSIS — K589 Irritable bowel syndrome without diarrhea: Secondary | ICD-10-CM | POA: Insufficient documentation

## 2021-08-21 DIAGNOSIS — K594 Anal spasm: Secondary | ICD-10-CM | POA: Insufficient documentation

## 2021-08-21 DIAGNOSIS — K5901 Slow transit constipation: Secondary | ICD-10-CM | POA: Diagnosis not present

## 2021-08-21 DIAGNOSIS — Z3A25 25 weeks gestation of pregnancy: Secondary | ICD-10-CM | POA: Insufficient documentation

## 2021-08-21 DIAGNOSIS — R102 Pelvic and perineal pain: Secondary | ICD-10-CM | POA: Diagnosis not present

## 2021-08-21 DIAGNOSIS — O26892 Other specified pregnancy related conditions, second trimester: Secondary | ICD-10-CM | POA: Diagnosis not present

## 2021-08-21 DIAGNOSIS — R319 Hematuria, unspecified: Secondary | ICD-10-CM | POA: Diagnosis not present

## 2021-08-21 DIAGNOSIS — O99612 Diseases of the digestive system complicating pregnancy, second trimester: Secondary | ICD-10-CM | POA: Insufficient documentation

## 2021-08-21 DIAGNOSIS — O09522 Supervision of elderly multigravida, second trimester: Secondary | ICD-10-CM | POA: Insufficient documentation

## 2021-08-21 DIAGNOSIS — Z3689 Encounter for other specified antenatal screening: Secondary | ICD-10-CM | POA: Insufficient documentation

## 2021-08-21 DIAGNOSIS — O26899 Other specified pregnancy related conditions, unspecified trimester: Secondary | ICD-10-CM

## 2021-08-21 NOTE — MAU Provider Note (Signed)
Chief Complaint:  Vaginal Pain and Rectal Pain   Event Date/Time   First Provider Initiated Contact with Patient 08/21/21 2356    HPI: Dana Downs is a 41 y.o. G2P1001 at 29w1dwho presents to maternity admissions reporting severe pain in rectum/anus.  Not sure if it is IBS or baby kicking   States has been doubled over in excrutiating pain.  Does not have a GI doctor currently.  Has had intermittent bouts of constipation but had a BM today.  . She reports good fetal movement, denies LOF, vaginal bleeding, vaginal itching/burning, urinary symptoms, h/a, dizziness, n/v, diarrhea, constipation or fever/chills.  Has rx for Proctofoam [facility] but pharmacy didn't have it  Vaginal Pain The patient's primary symptoms include pelvic pain. The patient's pertinent negatives include no genital itching, genital odor, vaginal bleeding or vaginal discharge. Primary symptoms comment: Rectal or anal pain moreso than vaginal. This is a recurrent problem. The problem has been waxing and waning. The pain is severe. She has tried acetaminophen for the symptoms. The treatment provided no relief.    RN Note: Having sharp pain in rectum for 2 wks. Have IBS so cannot take my meds due to pregnancy. Since 1500 the pain is worse and also having vaginal pain and pressure.Took Tylenol 1000mg  and Benedryl 25mg  x 2 at 2000. Did not help. Denies VB. Has normal FM today. Baby was footling breech and sometimes causes rectal pain to be worse when he is moving a lot.   Past Medical History: Past Medical History:  Diagnosis Date   Abnormal Pap smear    Constipation, chronic    Dysplasia    GERD (gastroesophageal reflux disease)    heartburn with pregnancy-occasional   Gestational diabetes    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    Numbness    right thumb   Pins and needles sensation    right arm   Polycystic ovarian syndrome     Past obstetric history: OB History  Gravida Para Term Preterm AB Living  2 1 1     1   SAB IAB  Ectopic Multiple Live Births          1    # Outcome Date GA Lbr Len/2nd Weight Sex Delivery Anes PTL Lv  2 Current           1 Term 09/02/11 [redacted]w[redacted]d  3390 g F CS-LTranv Spinal  LIV    Past Surgical History: Past Surgical History:  Procedure Laterality Date   CESAREAN SECTION  09/02/2011   Procedure: CESAREAN SECTION;  Surgeon: Marylynn Pearson, MD;  Location: [facility];  Service: Gynecology;  Laterality: N/A;  Primary cesarean section   CHOLECYSTECTOMY  2013   NECK SURGERY  age 2-gland removal   atypical microbacterial infection   TOOTH EXTRACTION      Family History: Family History  Problem Relation Age of Onset   Hypertension Mother    Hyperlipidemia Father    Cancer Paternal Grandmother        breast   Diabetes Paternal Grandmother     Social History: Social History   Tobacco Use   Smoking status: Former    Types: Cigarettes    Quit date: 09/18/2009    Years since quitting: 11.9   Smokeless tobacco: Never  Substance Use Topics   Alcohol use: No   Drug use: No    Allergies:  Allergies  Allergen Reactions   Penicillins Anaphylaxis   Acyclovir And Related    Doxycycline Hives  Sulfa Antibiotics Hives   Sulphur [Sulfur Sublimed] Hives    Meds:  Medications Prior to Admission  Medication Sig Dispense Refill Last Dose   Diclofenac Sodium 1 % CREA 4 gram qid prn (Patient not taking: Reported on 05/29/2021) 120 g 11    docusate sodium (COLACE) 100 MG capsule Take by mouth 2 (two) times daily.      Magnesium Oxide (MAG-OXIDE) 200 MG TABS Take 2 tablets (400 mg total) by mouth at bedtime. If that amount causes loose stools in the am, switch to 200mg  daily at bedtime. 60 tablet 3    meclizine (ANTIVERT) 25 MG tablet Take 1 tablet (25 mg total) by mouth 3 (three) times daily as needed for dizziness. 30 tablet 0    polyethylene glycol (MIRALAX / GLYCOLAX) 17 g packet Take 17 g by mouth daily.      Prenatal Vit-Fe Fumarate-FA (MULTIVITAMIN-PRENATAL) 27-0.8 MG TABS tablet  Take 1 tablet by mouth daily at 12 noon.       I have reviewed patient's Past Medical Hx, Surgical Hx, Family Hx, Social Hx, medications and allergies.   ROS:  Review of Systems  Genitourinary:  Positive for pelvic pain and vaginal pain. Negative for vaginal discharge.  Other systems negative  Physical Exam  Patient Vitals for the past 24 hrs:  BP Temp Pulse Resp SpO2 Height Weight  08/21/21 2333 127/78 -- -- -- -- -- --  08/21/21 2331 -- 98.1 F (36.7 C) (!) 106 18 100 % 5\' 9"  (1.753 m) 108.9 kg   Constitutional: Well-developed, well-nourished female in no acute distress.  Cardiovascular: normal rate  Respiratory: normal effort GI: Abd soft, non-tender, gravid appropriate for gestational age.   No rebound or guarding. MS: Extremities nontender, no edema, normal ROM Neurologic: Alert and oriented x 4.  GU: Neg CVAT.  RECTAL  EXAM:  no masses or obvious hemorrhoids palpable.  No blood visible  FHT:  Baseline 150 , moderate variability, accelerations present, no decelerations Contractions: occasional   Labs: Results for orders placed or performed during the hospital encounter of 08/21/21 (from the past 24 hour(s))  Urinalysis, Routine w reflex microscopic Urine, Clean Catch     Status: Abnormal   Collection Time: 08/21/21 11:34 PM  Result Value Ref Range   Color, Urine YELLOW YELLOW   APPearance CLEAR CLEAR   Specific Gravity, Urine 1.015 1.005 - 1.030   pH 6.0 5.0 - 8.0   Glucose, UA NEGATIVE NEGATIVE mg/dL   Hgb urine dipstick MODERATE (A) NEGATIVE   Bilirubin Urine NEGATIVE NEGATIVE   Ketones, ur 15 (A) NEGATIVE mg/dL   Protein, ur NEGATIVE NEGATIVE mg/dL   Nitrite NEGATIVE NEGATIVE   Leukocytes,Ua NEGATIVE NEGATIVE  Urinalysis, Microscopic (reflex)     Status: Abnormal   Collection Time: 08/21/21 11:34 PM  Result Value Ref Range   RBC / HPF 0-5 0 - 5 RBC/hpf   WBC, UA 0-5 0 - 5 WBC/hpf   Bacteria, UA RARE (A) NONE SEEN   Squamous Epithelial / LPF 0-5 0 - 5    Mucus PRESENT        Imaging:  US RENAL  Result Date: 08/22/2021 CLINICAL DATA:  Hematuria and pelvic pain. EXAM: RENAL / URINARY TRACT ULTRASOUND COMPLETE COMPARISON:  None. FINDINGS: Right Kidney: Renal measurements: 11.5 x 5.2 x 5.3 cm = volume: 165 mL. Echogenicity within normal limits. No mass or hydronephrosis visualized. Left Kidney: Renal measurements: 12.8 x 6.0 x 5.7 cm = volume: 230.4 mL. Echogenicity within normal limits. No  mass or hydronephrosis visualized. Bladder: The bladder is minimally distended. Other: A gravid uterus is noted. IMPRESSION: 1. Normal examination of the kidneys and bladder. 2. Gravid uterus. Electronically Signed   By: Brett Fairy M.D.   On: 08/22/2021 02:34     MAU Course/MDM: I have ordered labs and reviewed results. Hematuria noted on UA so [place] ordered to evaluate for possible kidney stones. This was negative. NST reviewed, reassuring.  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  Pt stable at time of discharge.  Hansel Feinstein CNM, MSN Certified Nurse-Midwife 08/21/2021 11:56 PM

## 2021-08-21 NOTE — MAU Note (Signed)
Having sharp pain in rectum for 2 wks. Have IBS so cannot take my meds due to pregnancy. Since 1500 the pain is worse and also having vaginal pain and pressure.Took Tylenol 1000mg  and Benedryl 25mg  x 2 at 2000. Did not help. Denies VB. Has normal FM today. Baby was footling breech and sometimes causes rectal pain to be worse when he is moving a lot.

## 2021-08-22 ENCOUNTER — Inpatient Hospital Stay (HOSPITAL_COMMUNITY): Payer: BC Managed Care – PPO

## 2021-08-22 DIAGNOSIS — Z3A25 25 weeks gestation of pregnancy: Secondary | ICD-10-CM

## 2021-08-22 DIAGNOSIS — K5901 Slow transit constipation: Secondary | ICD-10-CM

## 2021-08-22 DIAGNOSIS — R102 Pelvic and perineal pain: Secondary | ICD-10-CM

## 2021-08-22 DIAGNOSIS — K6289 Other specified diseases of anus and rectum: Secondary | ICD-10-CM

## 2021-08-22 LAB — URINALYSIS, ROUTINE W REFLEX MICROSCOPIC
Bilirubin Urine: NEGATIVE
Glucose, UA: NEGATIVE mg/dL
Ketones, ur: 15 mg/dL — AB
Leukocytes,Ua: NEGATIVE
Nitrite: NEGATIVE
Protein, ur: NEGATIVE mg/dL
Specific Gravity, Urine: 1.015 (ref 1.005–1.030)
pH: 6 (ref 5.0–8.0)

## 2021-08-22 LAB — URINALYSIS, MICROSCOPIC (REFLEX)

## 2021-08-22 IMAGING — US US RENAL
1 series · 15 of 25 positions shown · non-contrast
Comparison: None.

CLINICAL DATA: Hematuria and pelvic pain.

EXAM:
RENAL / URINARY TRACT ULTRASOUND COMPLETE

[Series 1: us renal · 15 of 27 slices shown]
[im 1/27]
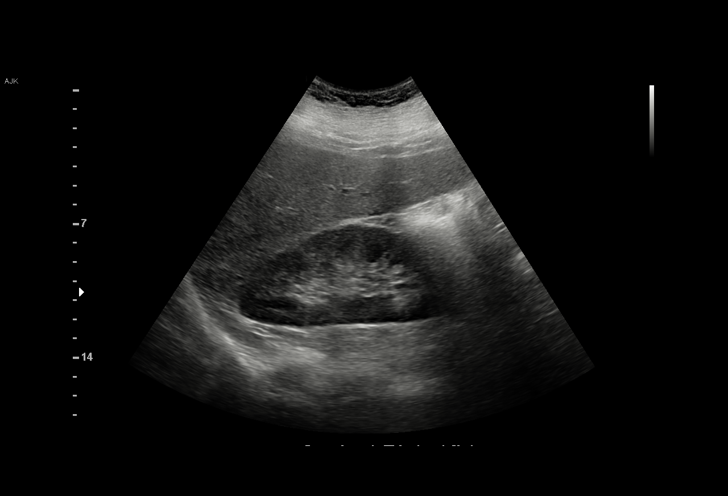
[im 3/27]
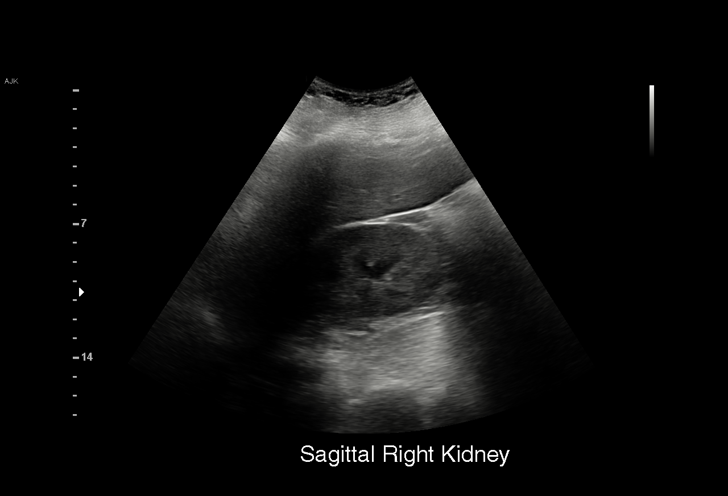
[im 5/27]
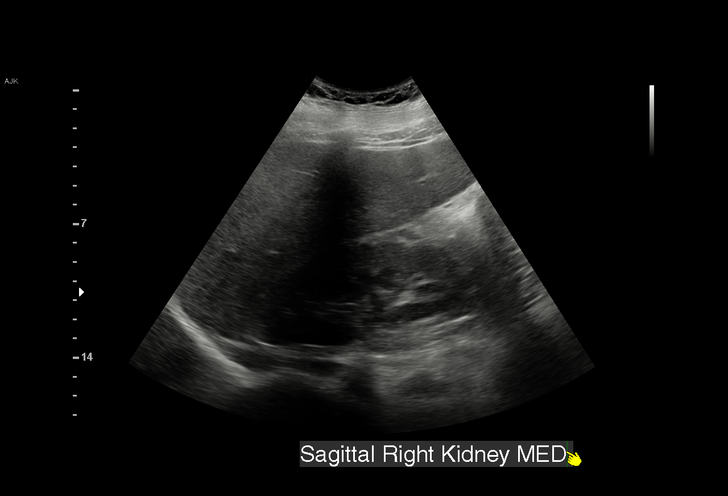
[im 6/27]
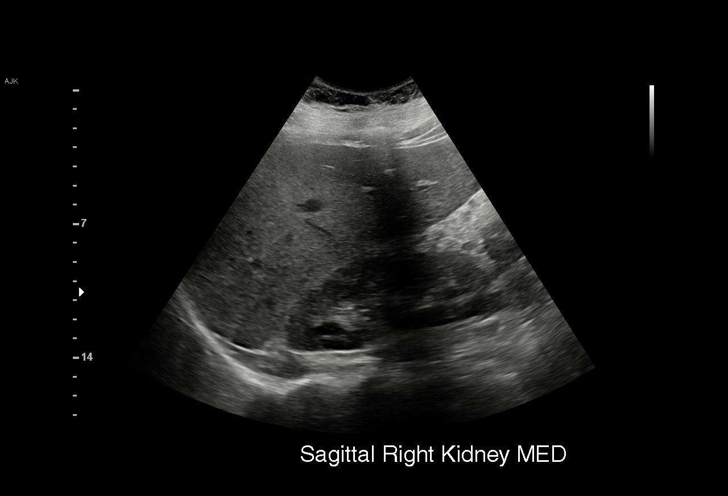
[im 8/27]
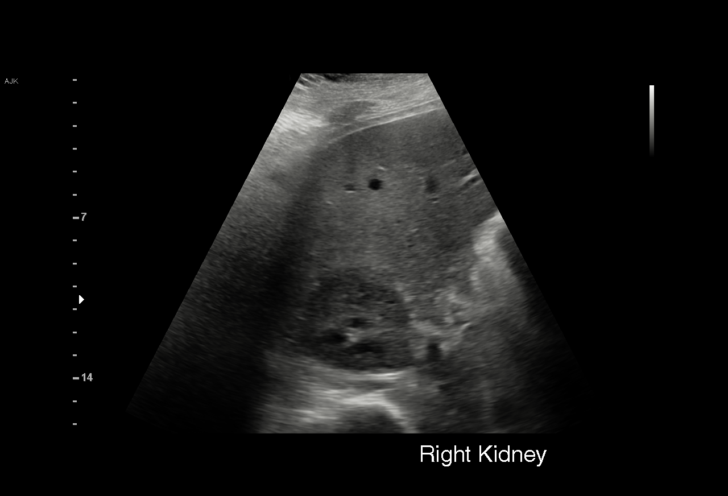
[im 10/27]
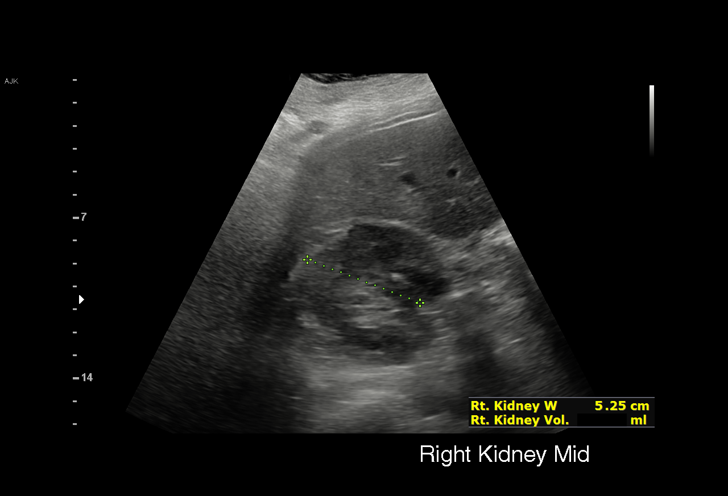
[im 11/27]
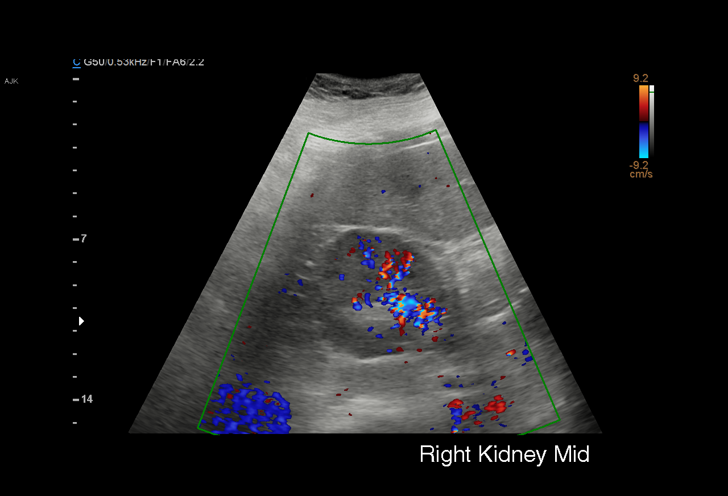
[im 14/27]
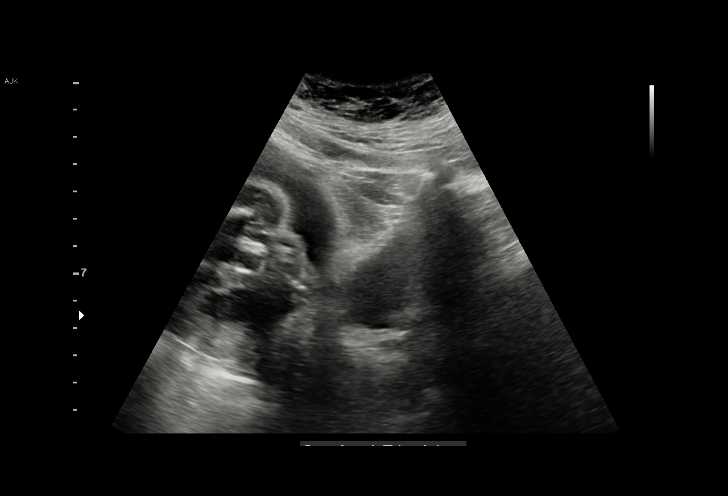
[im 16/27]
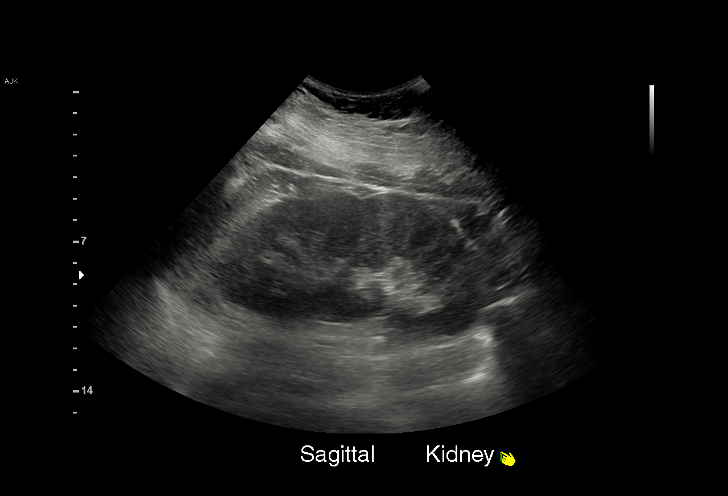
[im 17/27]
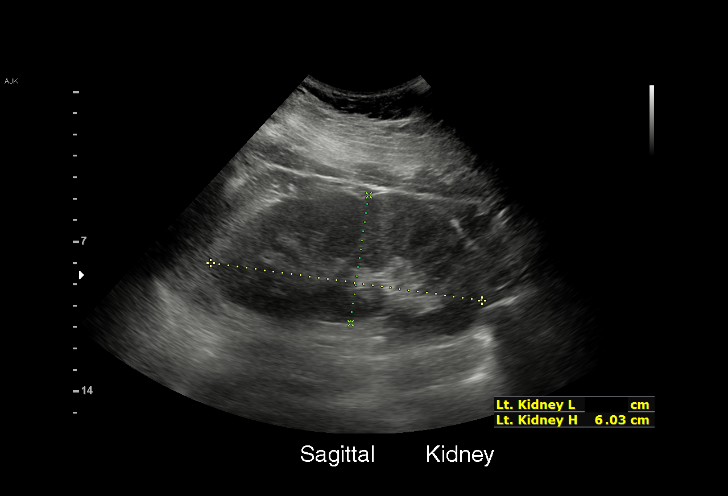
[im 19/27]
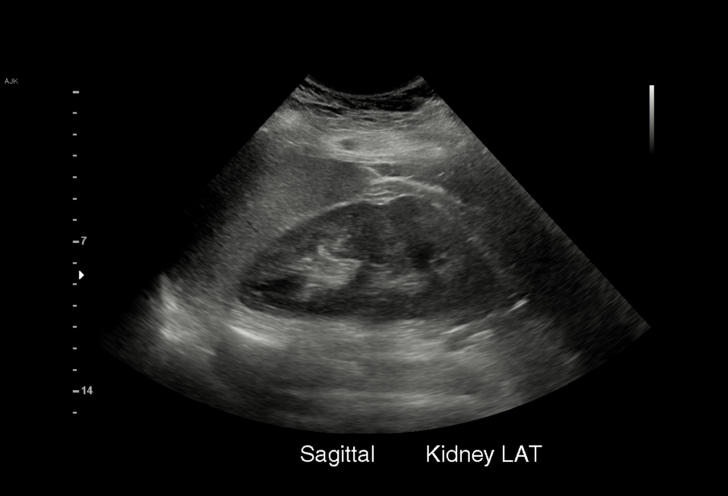
[im 21/27]
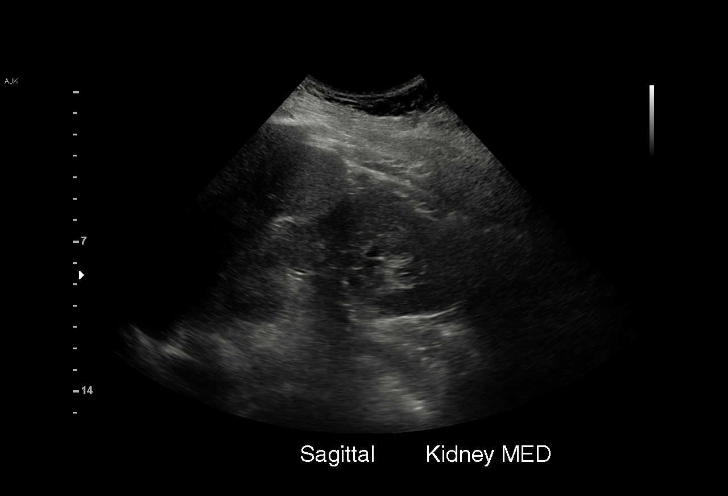
[im 22/27]
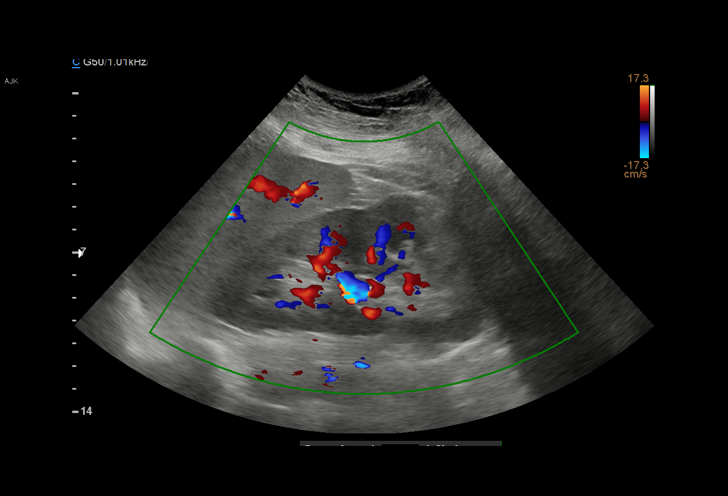
[im 24/27]
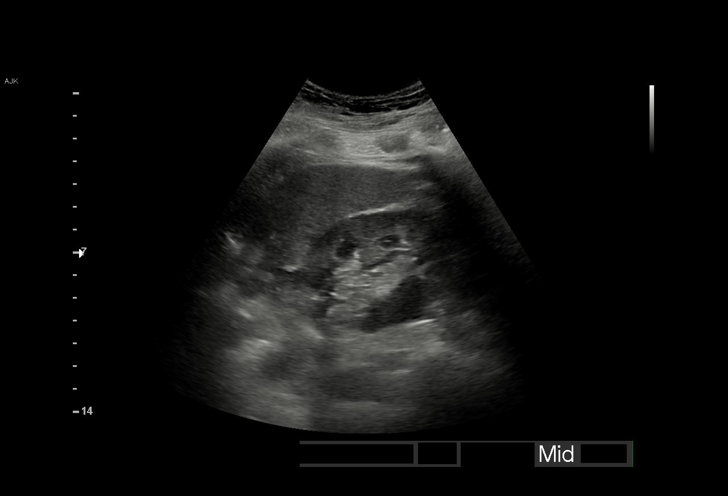
[im 27/27]
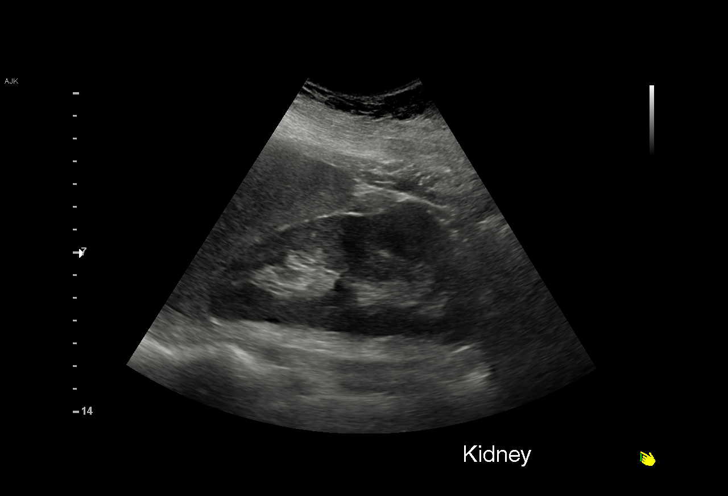

[15 of 25 positions shown; findings below may reference images not displayed]

FINDINGS: Right Kidney:

Renal measurements: 11.5 x 5.2 x 5.3 cm = volume: 165 mL.
Echogenicity within normal limits. No mass or hydronephrosis
visualized.

Left Kidney:

Renal measurements: 12.8 x 6.0 x 5.7 cm = volume: 230.4 mL.
Echogenicity within normal limits. No mass or hydronephrosis
visualized.

Bladder:

The bladder is minimally distended.

Other:

A gravid uterus is noted.
IMPRESSION: 1. Normal examination of the kidneys and bladder.
2. Gravid uterus.

## 2021-08-22 MED ORDER — HYOSCYAMINE SULFATE 0.125 MG SL SUBL
0.1250 mg | SUBLINGUAL_TABLET | Freq: Once | SUBLINGUAL | Status: AC
  Administered 2021-08-22: 0.125 mg via SUBLINGUAL
  Filled 2021-08-22: qty 1

## 2021-08-22 MED ORDER — OXYCODONE-ACETAMINOPHEN 5-325 MG PO TABS
1.0000 | ORAL_TABLET | Freq: Four times a day (QID) | ORAL | Status: DC | PRN
  Administered 2021-08-22: 2 via ORAL
  Filled 2021-08-22: qty 2

## 2021-08-22 MED ORDER — HYDROCORT-PRAMOXINE (PERIANAL) 1-1 % EX FOAM
1.0000 | Freq: Once | CUTANEOUS | Status: AC
  Administered 2021-08-22: 1 via RECTAL
  Filled 2021-08-22: qty 10

## 2021-08-22 MED ORDER — OXYCODONE-ACETAMINOPHEN 5-325 MG PO TABS: 1.0000 | ORAL_TABLET | Freq: Four times a day (QID) | ORAL | 0 refills | Status: DC | PRN

## 2021-08-22 NOTE — Progress Notes (Signed)
Dana Downs CNM in earlier to discuss test results and d/c plan. Written and verbal d/c instructions given and understanding voiced

## 2021-08-23 LAB — CULTURE, OB URINE: Culture: <10000 — AB

## 2021-10-26 ENCOUNTER — Inpatient Hospital Stay (HOSPITAL_COMMUNITY)
Admission: AD | Admit: 2021-10-26 | Discharge: 2021-10-27 | Disposition: A | Discharge status: Home / Self Care | Payer: BC Managed Care – PPO | Attending: Obstetrics and Gynecology | Admitting: Obstetrics and Gynecology

## 2021-10-26 ENCOUNTER — Encounter (HOSPITAL_COMMUNITY): Payer: Self-pay | Admitting: Obstetrics and Gynecology

## 2021-10-26 DIAGNOSIS — R1011 Right upper quadrant pain: Secondary | ICD-10-CM | POA: Insufficient documentation

## 2021-10-26 DIAGNOSIS — Z3A34 34 weeks gestation of pregnancy: Secondary | ICD-10-CM | POA: Insufficient documentation

## 2021-10-26 DIAGNOSIS — R030 Elevated blood-pressure reading, without diagnosis of hypertension: Secondary | ICD-10-CM | POA: Diagnosis present

## 2021-10-26 DIAGNOSIS — Z87891 Personal history of nicotine dependence: Secondary | ICD-10-CM | POA: Insufficient documentation

## 2021-10-26 DIAGNOSIS — Z880 Allergy status to penicillin: Secondary | ICD-10-CM | POA: Diagnosis not present

## 2021-10-26 DIAGNOSIS — Z3689 Encounter for other specified antenatal screening: Secondary | ICD-10-CM

## 2021-10-26 DIAGNOSIS — O26893 Other specified pregnancy related conditions, third trimester: Secondary | ICD-10-CM | POA: Diagnosis not present

## 2021-10-26 DIAGNOSIS — O133 Gestational [pregnancy-induced] hypertension without significant proteinuria, third trimester: Secondary | ICD-10-CM | POA: Diagnosis not present

## 2021-10-26 DIAGNOSIS — Z9049 Acquired absence of other specified parts of digestive tract: Secondary | ICD-10-CM | POA: Diagnosis not present

## 2021-10-26 LAB — CBC
HCT: 37.5 % (ref 36.0–46.0)
Hemoglobin: 13.3 g/dL (ref 12.0–15.0)
MCH: 32.9 pg (ref 26.0–34.0)
MCHC: 35.5 g/dL (ref 30.0–36.0)
MCV: 92.8 fL (ref 80.0–100.0)
Platelets: 247 10*3/uL (ref 150–400)
RBC: 4.04 MIL/uL (ref 3.87–5.11)
RDW: 13.1 % (ref 11.5–15.5)
WBC: 9.3 10*3/uL (ref 4.0–10.5)
nRBC: 0 % (ref 0.0–0.2)

## 2021-10-26 LAB — COMPREHENSIVE METABOLIC PANEL
ALT: 13 U/L (ref 0–44)
AST: 16 U/L (ref 15–41)
Albumin: 3.4 g/dL — ABNORMAL LOW (ref 3.5–5.0)
Alkaline Phosphatase: 95 U/L (ref 38–126)
Anion gap: 7 (ref 5–15)
BUN: 7 mg/dL (ref 6–20)
CO2: 23 mmol/L (ref 22–32)
Calcium: 9.1 mg/dL (ref 8.9–10.3)
Chloride: 108 mmol/L (ref 98–111)
Creatinine, Ser: 0.85 mg/dL (ref 0.44–1.00)
GFR, Estimated: >60 mL/min (ref >60)
Glucose, Bld: 87 mg/dL (ref 70–99)
Potassium: 4.3 mmol/L (ref 3.5–5.1)
Sodium: 138 mmol/L (ref 135–145)
Total Bilirubin: 0.4 mg/dL (ref 0.3–1.2)
Total Protein: 6.7 g/dL (ref 6.5–8.1)

## 2021-10-26 NOTE — MAU Provider Note (Signed)
?History  ?  ? ?CSN: [id number] ? ?Arrival date and time: 10/26/21 2146 ? ? Event Date/Time  ? First Provider Initiated Contact with Patient 10/26/21 2233   ?  ? ?Chief Complaint  ?Patient presents with  ? Hypertension  ? ?Dana Downs is a 41 y.o. G2P1001 at 39w5dwho receives care at [facility].  She presents today for Hypertension.  She reports she was seen at her primary OB office and informed of elevated blood pressure.  She states she had labs done, which returned normal.  Patient states she is instructed to take her blood pressure 3 times a day.  She denies other issues, with blood pressure, throughout the pregnancy.  However she does report GDM and states she has some shaking while in the shower and found her CBG to be 73.  She does state she has not eaten since 2 PM and had some juice after her incident of shaking.  In regards to her blood pressure, patient denies headache, visual disturbances, or shortness of breath.  Patient does endorse RUQ pain and states this has been present for the past 4 months.  She states the pain is present with bending and standing up and radiates down her right side.  She reports history of cholecystectomy in 2013.  Patient endorses fetal movement and occasional contractions, but denies vaginal concerns. ? ? ? ? ?OB History   ? ? Gravida  ?2  ? Para  ?1  ? Term  ?1  ? Preterm  ?   ? AB  ?   ? Living  ?1  ?  ? ? SAB  ?   ? IAB  ?   ? Ectopic  ?   ? Multiple  ?   ? Live Births  ?1  ?   ?  ?  ? ? ?Past Medical History:  ?Diagnosis Date  ? Abnormal Pap smear   ? Constipation, chronic   ? Dysplasia   ? GERD (gastroesophageal reflux disease)   ? heartburn with pregnancy-occasional  ? Gestational diabetes   ? Headache(784.0)   ? HPV in female   ? Numbness   ? right thumb  ? Pins and needles sensation   ? right arm  ? Polycystic ovarian syndrome   ? ? ?Past Surgical History:  ?Procedure Laterality Date  ? CESAREAN SECTION  09/02/2011  ? Procedure: CESAREAN SECTION;  Surgeon:  GMarylynn Pearson MD;  Location[facility];  Service: Gynecology;  Laterality: N/A;  Primary cesarean section  ? CHOLECYSTECTOMY  2013  ? NECK SURGERY  age 61-gland removal  ? atypical microbacterial infection  ? TOOTH EXTRACTION    ? ? ?Family History  ?Problem Relation Age of Onset  ? Hypertension Mother   ? Hyperlipidemia Father   ? Cancer Paternal Grandmother   ?     breast  ? Diabetes Paternal Grandmother   ? ? ?Social History  ? ?Tobacco Use  ? Smoking status: Former  ?  Types: Cigarettes  ?  Quit date: 09/18/2009  ?  Years since quitting: 12.1  ? Smokeless tobacco: Never  ?Substance Use Topics  ? Alcohol use: No  ? Drug use: No  ? ? ?Allergies:  ?Allergies  ?Allergen Reactions  ? Penicillins Anaphylaxis  ? Acyclovir And Related   ? Doxycycline Hives  ? Sodium Hypochlorite Cough  ? Sulfa Antibiotics Hives  ? Sulfasalazine Hives  ? Sulphur [Sulfur Sublimed] Hives  ? Tylenol [Acetaminophen] Hives  ? ? ?Medications Prior to  Admission  ?Medication Sig Dispense Refill Last Dose  ? docusate sodium (COLACE) 100 MG capsule Take by mouth 2 (two) times daily.   10/25/2021  ? polyethylene glycol (MIRALAX / GLYCOLAX) 17 g packet Take 17 g by mouth daily.   10/25/2021  ? Diclofenac Sodium 1 % CREA 4 gram qid prn (Patient not taking: Reported on 05/29/2021) 120 g 11   ? Magnesium Oxide (MAG-OXIDE) 200 MG TABS Take 2 tablets (400 mg total) by mouth at bedtime. If that amount causes loose stools in the am, switch to '200mg'$  daily at bedtime. 60 tablet 3 More than a month  ? meclizine (ANTIVERT) 25 MG tablet Take 1 tablet (25 mg total) by mouth 3 (three) times daily as needed for dizziness. 30 tablet 0 More than a month  ? oxyCODONE-acetaminophen (PERCOCET/ROXICET) 5-325 MG tablet Take 1-2 tablets by mouth every 6 (six) hours as needed for severe pain. 15 tablet 0 More than a month  ? Prenatal Vit-Fe Fumarate-FA (MULTIVITAMIN-PRENATAL) 27-0.8 MG TABS tablet Take 1 tablet by mouth daily at 12 noon.   More than a month  ? ? ?Review of  Systems  ?Eyes:  Negative for visual disturbance.  ?Respiratory:  Negative for cough and shortness of breath.   ?Gastrointestinal:  Positive for abdominal pain (Ctx) and constipation. Negative for nausea and vomiting.  ?Genitourinary:  Positive for difficulty urinating (In middle of night). Negative for dysuria, vaginal bleeding and vaginal discharge.  ?Neurological:  Negative for dizziness, light-headedness and headaches.  ?Physical Exam  ? ?Blood pressure (!) 156/96, pulse 93, temperature 97.6 ?F (36.4 ?C), temperature source Oral, resp. rate 20, height '5\' 9"'$  (1.753 m), weight 106.5 kg. ? ?Vitals:  ? 10/26/21 2320 10/26/21 2330  ?BP: (!) 156/94 (!) 146/93  ?Pulse: 71 69  ?Resp:    ?Temp:    ?SpO2: 100% 100%  ? ? ? ?Physical Exam ?Constitutional:   ?   Appearance: Normal appearance.  ?HENT:  ?   Head: Normocephalic and atraumatic.  ?Eyes:  ?   Conjunctiva/sclera: Conjunctivae normal.  ?Cardiovascular:  ?   Rate and Rhythm: Normal rate and regular rhythm.  ?Pulmonary:  ?   Effort: Pulmonary effort is normal. No respiratory distress.  ?   Breath sounds: Normal breath sounds.  ?Abdominal:  ?   General: Bowel sounds are normal.  ?   Palpations: Abdomen is soft.  ?   Comments: Gravid, Appears LGA  ?Genitourinary: ?   Comments: Dilation: Closed ?Effacement (%): Thick ?Station: Ballotable ?Exam by:: Gavin Pound, CNM ? ?-Curdy white discharge noted on exam glove.  ?Musculoskeletal:     ?   General: Normal range of motion.  ?   Cervical back: Normal range of motion.  ?Skin: ?   General: Skin is dry.  ?Neurological:  ?   Mental Status: She is alert and oriented to person, place, and time.  ?Psychiatric:     ?   Mood and Affect: Mood normal.     ?   Behavior: Behavior normal.  ? ? ?Fetal Assessment ?130 bpm, Mod Var, -Decels, +Accels ?Toco: Q8mn, palpates mild ? ?MAU Course  ? ?Results for orders placed or performed during the hospital encounter of 10/26/21 (from the past 24 hour(s))  ?CBC     Status: None  ? Collection  Time: 10/26/21 10:37 PM  ?Result Value Ref Range  ? WBC 9.3 4.0 - 10.5 K/uL  ? RBC 4.04 3.87 - 5.11 MIL/uL  ? Hemoglobin 13.3 12.0 - 15.0 g/dL  ?  HCT 37.5 36.0 - 46.0 %  ? MCV 92.8 80.0 - 100.0 fL  ? MCH 32.9 26.0 - 34.0 pg  ? MCHC 35.5 30.0 - 36.0 g/dL  ? RDW 13.1 11.5 - 15.5 %  ? Platelets 247 150 - 400 K/uL  ? nRBC 0.0 0.0 - 0.2 %  ?Comprehensive metabolic panel     Status: Abnormal  ? Collection Time: 10/26/21 10:37 PM  ?Result Value Ref Range  ? Sodium 138 135 - 145 mmol/L  ? Potassium 4.3 3.5 - 5.1 mmol/L  ? Chloride 108 98 - 111 mmol/L  ? CO2 23 22 - 32 mmol/L  ? Glucose, Bld 87 70 - 99 mg/dL  ? BUN 7 6 - 20 mg/dL  ? Creatinine, Ser 0.85 0.44 - 1.00 mg/dL  ? Calcium 9.1 8.9 - 10.3 mg/dL  ? Total Protein 6.7 6.5 - 8.1 g/dL  ? Albumin 3.4 (L) 3.5 - 5.0 g/dL  ? AST 16 15 - 41 U/L  ? ALT 13 0 - 44 U/L  ? Alkaline Phosphatase 95 38 - 126 U/L  ? Total Bilirubin 0.4 0.3 - 1.2 mg/dL  ? GFR, Estimated >60 >60 mL/min  ? Anion gap 7 5 - 15  ?Protein / creatinine ratio, urine     Status: None  ? Collection Time: 10/26/21 11:25 PM  ?Result Value Ref Range  ? Creatinine, Urine 42.80 mg/dL  ? Total Protein, Urine <6 mg/dL  ? Protein Creatinine Ratio        0.00 - 0.15 mg/mg[Cre]  ? ?No results found. ? ?MDM ?Physical Exam ?Labs: CBC, CMP, PC Ratio ?Measure BPQ15 min ?EFM ?Assessment and Plan  ?41 year old G2P1001  ?SIUP at 34.4 weeks ?Cat I FT ?GHTN ? ? ?-POC Reviewed ?-Exam performed ?-Labs ordered. ?-Discussed potential outcomes. ?-Patient reports f/u appt scheduled for Monday. ?-NST Reactive ?-Will monitor and reassess.  ? ?[name] MSN, CNM ?10/26/2021, 10:33 PM  ? ? ?Reassessment (12:06 AM) ?-Results as above. ?-Dr. Elray Mcgregor consulted and informed of patient status, evaluation, interventions, and results. Advised: ?*Discharge to home with precautions. ?*Patient to follow up as scheduled. ?-Provider to bedside to discuss results and POC. ?-Informed of diagnosis of gHTN and that she will likely be scheduled for IOL  at or shortly after 37 weeks.  ?-Addressed questions and concerns. ?-Instructed to monitor blood pressure once daily preferably at the same time. ?-Instructed of parameters of 140/90 with features to repor

## 2021-10-26 NOTE — MAU Note (Addendum)
PT SAYS SHE WENT TO DR TOMBLIN YESTERDAY - BP WAS 1304/92- FIRST TIME  ?DREW LABS - ALL NL ?WENT TO  PRIMARY DR THIS AM - BP - 130/86- AT 830AM ?CALLED OFFICE - TOLD TO MONITOR ALL WEEKEND- AND [facility] APPOINTMENT FOR Monday ?SO PT WENT AND BOUGHT BP- NOON-132/88 ?735PM- 146/104 159/102 ?CALLED OFFICE 811PM-159/94 ?839PM 153/108 ?DENIES H/A, NO BLURRED VISION, HAS RIGHT UPPER ABD PAIN SINCE 4 MTHS PREG  ?DENIES UC ? ?

## 2021-10-27 DIAGNOSIS — O133 Gestational [pregnancy-induced] hypertension without significant proteinuria, third trimester: Secondary | ICD-10-CM

## 2021-10-27 DIAGNOSIS — Z3A34 34 weeks gestation of pregnancy: Secondary | ICD-10-CM

## 2021-10-27 LAB — WET PREP, GENITAL
Clue Cells Wet Prep HPF POC: NONE SEEN
Sperm: NONE SEEN
Trich, Wet Prep: NONE SEEN
WBC, Wet Prep HPF POC: <10 (ref <10)
Yeast Wet Prep HPF POC: NONE SEEN

## 2021-10-27 LAB — PROTEIN / CREATININE RATIO, URINE
Creatinine, Urine: 42.8 mg/dL
Total Protein, Urine: <6 mg/dL

## 2021-10-30 ENCOUNTER — Telehealth (HOSPITAL_COMMUNITY): Payer: Self-pay | Admitting: *Deleted

## 2021-10-30 NOTE — Telephone Encounter (Signed)
Preadmission screen  

## 2021-10-30 NOTE — Patient Instructions (Signed)
Joyice Faster ? 10/30/2021 ? ? Your procedure is scheduled on:  11/12/2021 ? Arrive at 0800 at [place] on [address] at [facility]  and [employer]. You are invited to use the FREE valet parking or use the Visitor's parking deck. ? Pick up the phone at the desk and dial [phone]. ? Call this number if you have problems the morning of surgery: [phone] ? Remember: ? ? Do not eat food:(After Midnight) Desp?s de medianoche. ? Do not drink clear liquids: (After Midnight) Desp?s de medianoche. ? Take these medicines the morning of surgery with A SIP OF WATER:  none ? ? Do not wear jewelry, make-up or nail polish. ? Do not wear lotions, powders, or perfumes. Do not wear deodorant. ? Do not shave 48 hours prior to surgery. ? Do not bring valuables to the hospital.  [facility] is not  ? responsible for any belongings or valuables brought to the hospital. ? Contacts, dentures or bridgework may not be worn into surgery. ? Leave suitcase in the car. After surgery it may be brought to your room. ? For patients admitted to the hospital, checkout time is 11:00 AM the day of  ?            discharge. ? ?   ? Please read over the following fact sheets that you were given:  ?   Preparing for Surgery ? ? ?

## 2021-10-31 ENCOUNTER — Encounter (HOSPITAL_COMMUNITY): Payer: Self-pay

## 2021-10-31 ENCOUNTER — Encounter: Payer: Self-pay | Admitting: Family Medicine

## 2021-10-31 ENCOUNTER — Telehealth (HOSPITAL_COMMUNITY): Payer: Self-pay | Admitting: *Deleted

## 2021-10-31 NOTE — Telephone Encounter (Signed)
Preadmission screen  

## 2021-11-09 ENCOUNTER — Other Ambulatory Visit: Payer: Self-pay

## 2021-11-09 ENCOUNTER — Other Ambulatory Visit (HOSPITAL_COMMUNITY)
Admission: RE | Admit: 2021-11-09 | Discharge: 2021-11-09 | Disposition: A | Discharge status: Home / Self Care | Payer: BC Managed Care – PPO | Source: Ambulatory Visit | Attending: Family Medicine | Admitting: Family Medicine

## 2021-11-09 DIAGNOSIS — Z01812 Encounter for preprocedural laboratory examination: Secondary | ICD-10-CM | POA: Insufficient documentation

## 2021-11-09 DIAGNOSIS — O09519 Supervision of elderly primigravida, unspecified trimester: Secondary | ICD-10-CM | POA: Insufficient documentation

## 2021-11-09 DIAGNOSIS — Z3A00 Weeks of gestation of pregnancy not specified: Secondary | ICD-10-CM | POA: Insufficient documentation

## 2021-11-09 DIAGNOSIS — Z3490 Encounter for supervision of normal pregnancy, unspecified, unspecified trimester: Secondary | ICD-10-CM

## 2021-11-09 HISTORY — DX: Other complications of anesthesia, initial encounter: T88.59XA

## 2021-11-09 LAB — COMPREHENSIVE METABOLIC PANEL
ALT: 14 U/L (ref 0–44)
AST: 18 U/L (ref 15–41)
Albumin: 3 g/dL — ABNORMAL LOW (ref 3.5–5.0)
Alkaline Phosphatase: 112 U/L (ref 38–126)
Anion gap: 7 (ref 5–15)
BUN: 13 mg/dL (ref 6–20)
CO2: 22 mmol/L (ref 22–32)
Calcium: 8.6 mg/dL — ABNORMAL LOW (ref 8.9–10.3)
Chloride: 107 mmol/L (ref 98–111)
Creatinine, Ser: 1.07 mg/dL — ABNORMAL HIGH (ref 0.44–1.00)
GFR, Estimated: >60 mL/min (ref >60)
Glucose, Bld: 122 mg/dL — ABNORMAL HIGH (ref 70–99)
Potassium: 3.6 mmol/L (ref 3.5–5.1)
Sodium: 136 mmol/L (ref 135–145)
Total Bilirubin: 0.5 mg/dL (ref 0.3–1.2)
Total Protein: 6.2 g/dL — ABNORMAL LOW (ref 6.5–8.1)

## 2021-11-09 LAB — CBC
HCT: 40.8 % (ref 36.0–46.0)
Hemoglobin: 14.3 g/dL (ref 12.0–15.0)
MCH: 32.6 pg (ref 26.0–34.0)
MCHC: 35 g/dL (ref 30.0–36.0)
MCV: 93.2 fL (ref 80.0–100.0)
Platelets: 192 10*3/uL (ref 150–400)
RBC: 4.38 MIL/uL (ref 3.87–5.11)
RDW: 13.1 % (ref 11.5–15.5)
WBC: 6.5 10*3/uL (ref 4.0–10.5)
nRBC: 0 % (ref 0.0–0.2)

## 2021-11-09 LAB — RPR: RPR Ser Ql: NONREACTIVE

## 2021-11-09 LAB — TYPE AND SCREEN
ABO/RH(D): O POS
Antibody Screen: NEGATIVE

## 2021-11-12 ENCOUNTER — Encounter (HOSPITAL_COMMUNITY): Payer: Self-pay | Admitting: Obstetrics and Gynecology

## 2021-11-12 ENCOUNTER — Inpatient Hospital Stay (HOSPITAL_COMMUNITY): Payer: BC Managed Care – PPO | Admitting: Anesthesiology

## 2021-11-12 ENCOUNTER — Other Ambulatory Visit: Payer: Self-pay

## 2021-11-12 ENCOUNTER — Encounter (HOSPITAL_COMMUNITY)
Admission: RE | Disposition: A | Discharge status: Home / Self Care | Payer: Self-pay | Source: Home / Self Care | Attending: Obstetrics and Gynecology

## 2021-11-12 ENCOUNTER — Inpatient Hospital Stay (HOSPITAL_COMMUNITY)
Admission: RE | Admit: 2021-11-12 | Discharge: 2021-11-16 | DRG: 785 | Disposition: A | Discharge status: Home / Self Care | Payer: BC Managed Care – PPO | Attending: Obstetrics and Gynecology | Admitting: Obstetrics and Gynecology

## 2021-11-12 DIAGNOSIS — O134 Gestational [pregnancy-induced] hypertension without significant proteinuria, complicating childbirth: Secondary | ICD-10-CM | POA: Diagnosis present

## 2021-11-12 DIAGNOSIS — O24420 Gestational diabetes mellitus in childbirth, diet controlled: Secondary | ICD-10-CM | POA: Diagnosis present

## 2021-11-12 DIAGNOSIS — O34211 Maternal care for low transverse scar from previous cesarean delivery: Secondary | ICD-10-CM | POA: Diagnosis present

## 2021-11-12 DIAGNOSIS — Z302 Encounter for sterilization: Secondary | ICD-10-CM | POA: Diagnosis not present

## 2021-11-12 DIAGNOSIS — O99214 Obesity complicating childbirth: Secondary | ICD-10-CM | POA: Diagnosis present

## 2021-11-12 DIAGNOSIS — Z3A37 37 weeks gestation of pregnancy: Secondary | ICD-10-CM

## 2021-11-12 DIAGNOSIS — O902 Hematoma of obstetric wound: Secondary | ICD-10-CM | POA: Diagnosis not present

## 2021-11-12 DIAGNOSIS — Z87891 Personal history of nicotine dependence: Secondary | ICD-10-CM

## 2021-11-12 DIAGNOSIS — Z3490 Encounter for supervision of normal pregnancy, unspecified, unspecified trimester: Principal | ICD-10-CM

## 2021-11-12 LAB — COMPREHENSIVE METABOLIC PANEL WITH GFR
ALT: 42 U/L (ref 0–44)
AST: 20 U/L (ref 15–41)
Albumin: 3.1 g/dL — ABNORMAL LOW (ref 3.5–5.0)
Alkaline Phosphatase: 122 U/L (ref 38–126)
Anion gap: 9 (ref 5–15)
BUN: 12 mg/dL (ref 6–20)
CO2: 19 mmol/L — ABNORMAL LOW (ref 22–32)
Calcium: 9.2 mg/dL (ref 8.9–10.3)
Chloride: 110 mmol/L (ref 98–111)
Creatinine, Ser: 0.98 mg/dL (ref 0.44–1.00)
GFR, Estimated: >60 mL/min
Glucose, Bld: 81 mg/dL (ref 70–99)
Potassium: 4 mmol/L (ref 3.5–5.1)
Sodium: 138 mmol/L (ref 135–145)
Total Bilirubin: 0.7 mg/dL (ref 0.3–1.2)
Total Protein: 6.6 g/dL (ref 6.5–8.1)

## 2021-11-12 LAB — CBC
HCT: 40.7 % (ref 36.0–46.0)
HCT: 36.4 % (ref 36.0–46.0)
Hemoglobin: 14.3 g/dL (ref 12.0–15.0)
Hemoglobin: 13 g/dL (ref 12.0–15.0)
MCH: 32.6 pg (ref 26.0–34.0)
MCH: 33 pg (ref 26.0–34.0)
MCHC: 35.1 g/dL (ref 30.0–36.0)
MCHC: 35.7 g/dL (ref 30.0–36.0)
MCV: 92.9 fL (ref 80.0–100.0)
MCV: 92.4 fL (ref 80.0–100.0)
Platelets: 207 K/uL (ref 150–400)
Platelets: 193 10*3/uL (ref 150–400)
RBC: 4.38 MIL/uL (ref 3.87–5.11)
RBC: 3.94 MIL/uL (ref 3.87–5.11)
RDW: 13.2 % (ref 11.5–15.5)
RDW: 13 % (ref 11.5–15.5)
WBC: 7.3 K/uL (ref 4.0–10.5)
WBC: 14.1 10*3/uL — ABNORMAL HIGH (ref 4.0–10.5)
nRBC: 0 % (ref 0.0–0.2)
nRBC: 0 % (ref 0.0–0.2)

## 2021-11-12 LAB — COMPREHENSIVE METABOLIC PANEL
ALT: 17 U/L (ref 0–44)
AST: 24 U/L (ref 15–41)
Albumin: 2.7 g/dL — ABNORMAL LOW (ref 3.5–5.0)
Alkaline Phosphatase: 96 U/L (ref 38–126)
Anion gap: 10 (ref 5–15)
BUN: 10 mg/dL (ref 6–20)
CO2: 19 mmol/L — ABNORMAL LOW (ref 22–32)
Calcium: 8.1 mg/dL — ABNORMAL LOW (ref 8.9–10.3)
Chloride: 103 mmol/L (ref 98–111)
Creatinine, Ser: 0.91 mg/dL (ref 0.44–1.00)
GFR, Estimated: >60 mL/min (ref >60)
Glucose, Bld: 128 mg/dL — ABNORMAL HIGH (ref 70–99)
Potassium: 3.9 mmol/L (ref 3.5–5.1)
Sodium: 132 mmol/L — ABNORMAL LOW (ref 135–145)
Total Bilirubin: 0.5 mg/dL (ref 0.3–1.2)
Total Protein: 5.6 g/dL — ABNORMAL LOW (ref 6.5–8.1)

## 2021-11-12 LAB — GLUCOSE, CAPILLARY
Glucose-Capillary: 83 mg/dL (ref 70–99)
Glucose-Capillary: 82 mg/dL (ref 70–99)

## 2021-11-12 SURGERY — Surgical Case
Anesthesia: Spinal | Laterality: Bilateral

## 2021-11-12 MED ORDER — OXYCODONE HCL 5 MG PO TABS
5.0000 mg | ORAL_TABLET | Freq: Four times a day (QID) | ORAL | Status: DC | PRN
  Administered 2021-11-13 – 2021-11-14 (×2): 5 mg via ORAL
  Filled 2021-11-12 (×2): qty 1

## 2021-11-12 MED ORDER — IBUPROFEN 600 MG PO TABS
600.0000 mg | ORAL_TABLET | Freq: Four times a day (QID) | ORAL | Status: DC | PRN
Start: 2021-11-12 — End: 2021-11-16
  Administered 2021-11-12 – 2021-11-16 (×12): 600 mg via ORAL
  Filled 2021-11-12 (×12): qty 1

## 2021-11-12 MED ORDER — WITCH HAZEL-GLYCERIN EX PADS: 1.0000 "application " | MEDICATED_PAD | CUTANEOUS | Status: DC | PRN

## 2021-11-12 MED ORDER — FENTANYL CITRATE (PF) 100 MCG/2ML IJ SOLN: 25.0000 ug | INTRAMUSCULAR | Status: DC | PRN

## 2021-11-12 MED ORDER — SENNOSIDES-DOCUSATE SODIUM 8.6-50 MG PO TABS
2.0000 | ORAL_TABLET | Freq: Every day | ORAL | Status: DC
  Administered 2021-11-13 – 2021-11-16 (×4): 2 via ORAL
  Filled 2021-11-12 (×4): qty 2

## 2021-11-12 MED ORDER — ONDANSETRON HCL 4 MG/2ML IJ SOLN: 4.0000 mg | Freq: Once | INTRAMUSCULAR | Status: DC | PRN

## 2021-11-12 MED ORDER — HYDROMORPHONE HCL 1 MG/ML IJ SOLN: 0.2000 mg | INTRAMUSCULAR | Status: DC | PRN

## 2021-11-12 MED ORDER — FENTANYL CITRATE (PF) 100 MCG/2ML IJ SOLN
INTRAMUSCULAR | Status: AC
  Filled 2021-11-12: qty 2

## 2021-11-12 MED ORDER — PRENATAL MULTIVITAMIN CH
1.0000 | ORAL_TABLET | Freq: Every day | ORAL | Status: DC
  Administered 2021-11-16: 1 via ORAL
  Filled 2021-11-12 (×2): qty 1

## 2021-11-12 MED ORDER — DEXAMETHASONE SODIUM PHOSPHATE 4 MG/ML IJ SOLN
INTRAMUSCULAR | Status: AC
  Filled 2021-11-12: qty 1

## 2021-11-12 MED ORDER — ONDANSETRON HCL 4 MG/2ML IJ SOLN
INTRAMUSCULAR | Status: DC | PRN
  Administered 2021-11-12: 4 mg via INTRAVENOUS

## 2021-11-12 MED ORDER — AMISULPRIDE (ANTIEMETIC) 5 MG/2ML IV SOLN
10.0000 mg | Freq: Once | INTRAVENOUS | Status: DC | PRN
  Filled 2021-11-12: qty 4

## 2021-11-12 MED ORDER — MAGNESIUM SULFATE BOLUS VIA INFUSION
4.0000 g | Freq: Once | INTRAVENOUS | Status: AC
  Administered 2021-11-12: 4 g via INTRAVENOUS
  Filled 2021-11-12: qty 1000

## 2021-11-12 MED ORDER — TETANUS-DIPHTH-ACELL PERTUSSIS 5-2.5-18.5 LF-MCG/0.5 IM SUSY: 0.5000 mL | PREFILLED_SYRINGE | Freq: Once | INTRAMUSCULAR | Status: DC

## 2021-11-12 MED ORDER — ONDANSETRON HCL 4 MG/2ML IJ SOLN
INTRAMUSCULAR | Status: AC
  Filled 2021-11-12: qty 2

## 2021-11-12 MED ORDER — KETOROLAC TROMETHAMINE 30 MG/ML IJ SOLN
30.0000 mg | Freq: Four times a day (QID) | INTRAMUSCULAR | Status: AC | PRN
  Filled 2021-11-12: qty 1

## 2021-11-12 MED ORDER — DIPHENHYDRAMINE HCL 25 MG PO CAPS: 25.0000 mg | ORAL_CAPSULE | ORAL | Status: DC | PRN

## 2021-11-12 MED ORDER — NALOXONE HCL 4 MG/10ML IJ SOLN
1.0000 ug/kg/h | INTRAVENOUS | Status: DC | PRN
  Filled 2021-11-12: qty 5

## 2021-11-12 MED ORDER — FENTANYL CITRATE (PF) 100 MCG/2ML IJ SOLN
INTRAMUSCULAR | Status: DC | PRN
  Administered 2021-11-12: 15 ug via INTRAVENOUS

## 2021-11-12 MED ORDER — KETOROLAC TROMETHAMINE 30 MG/ML IJ SOLN
INTRAMUSCULAR | Status: AC
  Filled 2021-11-12: qty 1

## 2021-11-12 MED ORDER — POVIDONE-IODINE 10 % EX SWAB
2.0000 "application " | Freq: Once | CUTANEOUS | Status: AC
  Administered 2021-11-12: 2 via TOPICAL

## 2021-11-12 MED ORDER — CLINDAMYCIN PHOSPHATE 900 MG/50ML IV SOLN: 900.0000 mg | INTRAVENOUS | Status: DC

## 2021-11-12 MED ORDER — DIPHENHYDRAMINE HCL 50 MG/ML IJ SOLN: 12.5000 mg | INTRAMUSCULAR | Status: DC | PRN

## 2021-11-12 MED ORDER — MAGNESIUM SULFATE 40 GM/1000ML IV SOLN
INTRAVENOUS | Status: AC
  Filled 2021-11-12: qty 1000

## 2021-11-12 MED ORDER — BUPIVACAINE IN DEXTROSE 0.75-8.25 % IT SOLN
INTRATHECAL | Status: DC | PRN
  Administered 2021-11-12: 1.6 mL via INTRATHECAL

## 2021-11-12 MED ORDER — NALOXONE HCL 0.4 MG/ML IJ SOLN: 0.4000 mg | INTRAMUSCULAR | Status: DC | PRN

## 2021-11-12 MED ORDER — ZOLPIDEM TARTRATE 5 MG PO TABS: 5.0000 mg | ORAL_TABLET | Freq: Every evening | ORAL | Status: DC | PRN

## 2021-11-12 MED ORDER — MORPHINE SULFATE (PF) 0.5 MG/ML IJ SOLN
INTRAMUSCULAR | Status: AC
  Filled 2021-11-12: qty 10

## 2021-11-12 MED ORDER — OXYTOCIN-SODIUM CHLORIDE 30-0.9 UT/500ML-% IV SOLN: 2.5000 [IU]/h | INTRAVENOUS | Status: AC

## 2021-11-12 MED ORDER — COCONUT OIL OIL
1.0000 "application " | TOPICAL_OIL | Status: DC | PRN
  Administered 2021-11-13: 1 via TOPICAL

## 2021-11-12 MED ORDER — LACTATED RINGERS IV SOLN: INTRAVENOUS | Status: AC

## 2021-11-12 MED ORDER — SODIUM CHLORIDE 0.9% FLUSH
3.0000 mL | INTRAVENOUS | Status: DC | PRN
Start: 2021-11-12 — End: 2021-11-16

## 2021-11-12 MED ORDER — DEXTROSE 5 % IV SOLN
5.0000 mg/kg | INTRAVENOUS | Status: DC
  Filled 2021-11-12: qty 10.25

## 2021-11-12 MED ORDER — MENTHOL 3 MG MT LOZG: 1.0000 | LOZENGE | OROMUCOSAL | Status: DC | PRN

## 2021-11-12 MED ORDER — MORPHINE SULFATE (PF) 0.5 MG/ML IJ SOLN
INTRAMUSCULAR | Status: DC | PRN
  Administered 2021-11-12: .15 mg via EPIDURAL

## 2021-11-12 MED ORDER — SIMETHICONE 80 MG PO CHEW: 80.0000 mg | CHEWABLE_TABLET | ORAL | Status: DC | PRN

## 2021-11-12 MED ORDER — MAGNESIUM SULFATE BOLUS VIA INFUSION
4.0000 g | Freq: Once | INTRAVENOUS | Status: DC
  Filled 2021-11-12: qty 1000

## 2021-11-12 MED ORDER — DEXMEDETOMIDINE (PRECEDEX) IN NS 20 MCG/5ML (4 MCG/ML) IV SYRINGE
PREFILLED_SYRINGE | INTRAVENOUS | Status: AC
  Filled 2021-11-12: qty 5

## 2021-11-12 MED ORDER — DEXAMETHASONE SODIUM PHOSPHATE 4 MG/ML IJ SOLN
INTRAMUSCULAR | Status: DC | PRN
  Administered 2021-11-12: 4 mg via INTRAVENOUS

## 2021-11-12 MED ORDER — CLINDAMYCIN PHOSPHATE 900 MG/50ML IV SOLN
900.0000 mg | INTRAVENOUS | Status: AC
  Administered 2021-11-12: 900 mg via INTRAVENOUS

## 2021-11-12 MED ORDER — MAGNESIUM SULFATE 40 GM/1000ML IV SOLN
2.0000 g/h | INTRAVENOUS | Status: AC
Start: 2021-11-12 — End: 2021-11-13
  Filled 2021-11-12: qty 1000

## 2021-11-12 MED ORDER — DEXMEDETOMIDINE (PRECEDEX) IN NS 20 MCG/5ML (4 MCG/ML) IV SYRINGE
PREFILLED_SYRINGE | INTRAVENOUS | Status: DC | PRN
  Administered 2021-11-12: 8 ug via INTRAVENOUS

## 2021-11-12 MED ORDER — OXYTOCIN-SODIUM CHLORIDE 30-0.9 UT/500ML-% IV SOLN
INTRAVENOUS | Status: AC
  Filled 2021-11-12: qty 500

## 2021-11-12 MED ORDER — GENTAMICIN SULFATE 40 MG/ML IJ SOLN
5.0000 mg/kg | INTRAVENOUS | Status: DC
  Filled 2021-11-12: qty 13.25

## 2021-11-12 MED ORDER — DIBUCAINE (PERIANAL) 1 % EX OINT: 1.0000 "application " | TOPICAL_OINTMENT | CUTANEOUS | Status: DC | PRN

## 2021-11-12 MED ORDER — PHENYLEPHRINE HCL-NACL 20-0.9 MG/250ML-% IV SOLN
INTRAVENOUS | Status: AC
  Filled 2021-11-12: qty 250

## 2021-11-12 MED ORDER — GENTAMICIN SULFATE 40 MG/ML IJ SOLN
5.0000 mg/kg | INTRAVENOUS | Status: AC
  Administered 2021-11-12: 410 mg via INTRAVENOUS
  Filled 2021-11-12: qty 10.25

## 2021-11-12 MED ORDER — STERILE WATER FOR IRRIGATION IR SOLN
Status: DC | PRN
  Administered 2021-11-12: 1

## 2021-11-12 MED ORDER — OXYTOCIN-SODIUM CHLORIDE 30-0.9 UT/500ML-% IV SOLN
INTRAVENOUS | Status: DC | PRN
  Administered 2021-11-12: 300 mL via INTRAVENOUS

## 2021-11-12 MED ORDER — LACTATED RINGERS IV SOLN: INTRAVENOUS | Status: DC

## 2021-11-12 MED ORDER — KETOROLAC TROMETHAMINE 30 MG/ML IJ SOLN
30.0000 mg | Freq: Once | INTRAMUSCULAR | Status: AC | PRN
  Administered 2021-11-12: 30 mg via INTRAVENOUS

## 2021-11-12 MED ORDER — MEPERIDINE HCL 25 MG/ML IJ SOLN: 6.2500 mg | INTRAMUSCULAR | Status: DC | PRN

## 2021-11-12 MED ORDER — PHENYLEPHRINE HCL-NACL 20-0.9 MG/250ML-% IV SOLN
INTRAVENOUS | Status: DC | PRN
  Administered 2021-11-12: 60 ug/min via INTRAVENOUS

## 2021-11-12 MED ORDER — CLINDAMYCIN PHOSPHATE 900 MG/50ML IV SOLN
INTRAVENOUS | Status: AC
  Filled 2021-11-12: qty 50

## 2021-11-12 MED ORDER — SOD CITRATE-CITRIC ACID 500-334 MG/5ML PO SOLN
ORAL | Status: AC
  Filled 2021-11-12: qty 30

## 2021-11-12 MED ORDER — MAGNESIUM SULFATE 40 GM/1000ML IV SOLN
2.0000 g/h | INTRAVENOUS | Status: AC
  Administered 2021-11-13: 2 g/h via INTRAVENOUS

## 2021-11-12 MED ORDER — OXYCODONE HCL 5 MG PO TABS: 5.0000 mg | ORAL_TABLET | ORAL | Status: DC | PRN

## 2021-11-12 MED ORDER — DEXAMETHASONE SODIUM PHOSPHATE 10 MG/ML IJ SOLN
INTRAMUSCULAR | Status: AC
  Filled 2021-11-12: qty 1

## 2021-11-12 MED ORDER — SODIUM CHLORIDE 0.9 % IR SOLN
Status: DC | PRN
  Administered 2021-11-12: 1

## 2021-11-12 MED ORDER — SIMETHICONE 80 MG PO CHEW
80.0000 mg | CHEWABLE_TABLET | Freq: Three times a day (TID) | ORAL | Status: DC
  Administered 2021-11-13 – 2021-11-16 (×10): 80 mg via ORAL
  Filled 2021-11-12 (×10): qty 1

## 2021-11-12 MED ORDER — ONDANSETRON HCL 4 MG/2ML IJ SOLN
4.0000 mg | Freq: Three times a day (TID) | INTRAMUSCULAR | Status: DC | PRN
  Administered 2021-11-12: 4 mg via INTRAVENOUS

## 2021-11-12 MED ORDER — DIPHENHYDRAMINE HCL 25 MG PO CAPS: 25.0000 mg | ORAL_CAPSULE | Freq: Four times a day (QID) | ORAL | Status: DC | PRN

## 2021-11-12 MED ORDER — SOD CITRATE-CITRIC ACID 500-334 MG/5ML PO SOLN
30.0000 mL | ORAL | Status: AC
  Administered 2021-11-12: 30 mL via ORAL

## 2021-11-12 MED ORDER — KETOROLAC TROMETHAMINE 30 MG/ML IJ SOLN
30.0000 mg | Freq: Four times a day (QID) | INTRAMUSCULAR | Status: AC | PRN
  Administered 2021-11-13: 30 mg via INTRAVENOUS
  Filled 2021-11-12: qty 1

## 2021-11-12 SURGICAL SUPPLY — 36 items
BENZOIN TINCTURE PRP APPL 2/3 (GAUZE/BANDAGES/DRESSINGS) ×1 IMPLANT
CLAMP CORD UMBIL (MISCELLANEOUS) IMPLANT
CLOTH BEACON ORANGE TIMEOUT ST (SAFETY) ×2 IMPLANT
DERMABOND ADVANCED (GAUZE/BANDAGES/DRESSINGS)
DERMABOND ADVANCED .7 DNX12 (GAUZE/BANDAGES/DRESSINGS) IMPLANT
DRSG OPSITE POSTOP 4X10 (GAUZE/BANDAGES/DRESSINGS) ×2 IMPLANT
ELECT REM PT RETURN 9FT ADLT (ELECTROSURGICAL) ×2
ELECTRODE REM PT RTRN 9FT ADLT (ELECTROSURGICAL) ×1 IMPLANT
EXTRACTOR VACUUM M CUP 4 TUBE (SUCTIONS) IMPLANT
GLOVE BIO SURGEON STRL SZ7.5 (GLOVE) ×2 IMPLANT
GLOVE BIOGEL PI IND STRL 7.0 (GLOVE) ×1 IMPLANT
GLOVE BIOGEL PI INDICATOR 7.0 (GLOVE) ×1
GOWN STRL REUS W/TWL LRG LVL3 (GOWN DISPOSABLE) ×4 IMPLANT
KIT ABG SYR 3ML LUER SLIP (SYRINGE) ×2 IMPLANT
NDL HYPO 25X5/8 SAFETYGLIDE (NEEDLE) ×1 IMPLANT
NEEDLE HYPO 22GX1.5 SAFETY (NEEDLE) ×2 IMPLANT
NEEDLE HYPO 25X5/8 SAFETYGLIDE (NEEDLE) ×2 IMPLANT
NS IRRIG 1000ML POUR BTL (IV SOLUTION) ×2 IMPLANT
PACK C SECTION WH (CUSTOM PROCEDURE TRAY) ×2 IMPLANT
PAD ABD 7.5X8 STRL (GAUZE/BANDAGES/DRESSINGS) ×1 IMPLANT
PAD OB MATERNITY 4.3X12.25 (PERSONAL CARE ITEMS) ×2 IMPLANT
PENCIL SMOKE EVAC W/HOLSTER (ELECTROSURGICAL) ×2 IMPLANT
SPONGE GAUZE 4X4 12PLY STER LF (GAUZE/BANDAGES/DRESSINGS) ×2 IMPLANT
STRIP CLOSURE SKIN 1/2X4 (GAUZE/BANDAGES/DRESSINGS) ×1 IMPLANT
SUT MNCRL 0 VIOLET CTX 36 (SUTURE) ×4 IMPLANT
SUT MONOCRYL 0 CTX 36 (SUTURE) ×8
SUT PDS AB 0 CTX 60 (SUTURE) ×2 IMPLANT
SUT PLAIN 0 NONE (SUTURE) IMPLANT
SUT PLAIN 2 0 (SUTURE)
SUT PLAIN 2 0 XLH (SUTURE) ×2 IMPLANT
SUT PLAIN ABS 2-0 CT1 27XMFL (SUTURE) IMPLANT
SUT VIC AB 4-0 KS 27 (SUTURE) ×2 IMPLANT
TAPE CLOTH SURG 4X10 WHT LF (GAUZE/BANDAGES/DRESSINGS) ×1 IMPLANT
TOWEL OR 17X24 6PK STRL BLUE (TOWEL DISPOSABLE) ×2 IMPLANT
TRAY FOLEY W/BAG SLVR 14FR LF (SET/KITS/TRAYS/PACK) ×2 IMPLANT
WATER STERILE IRR 1000ML POUR (IV SOLUTION) ×2 IMPLANT

## 2021-11-12 NOTE — Anesthesia Preprocedure Evaluation (Addendum)
Anesthesia Evaluation  ?Patient identified by MRN, date of birth, ID band ?Patient awake ? ? ? ?Reviewed: ?Allergy & Precautions, NPO status , Patient's Chart, lab work & pertinent test results ? ?Airway ?Mallampati: II ? ?TM Distance: >3 FB ?Neck ROM: Full ? ? ? Dental ?no notable dental hx. ? ?  ?Pulmonary ?neg pulmonary ROS, Patient abstained from smoking., former smoker,  ?  ?Pulmonary exam normal ?breath sounds clear to auscultation ? ? ? ? ? ? Cardiovascular ?hypertension, Normal cardiovascular exam ?Rhythm:Regular Rate:Normal ? ? ?  ?Neuro/Psych ? Headaches, ?paresthesia noted in problem list, ? thumb ?negative psych ROS  ? GI/Hepatic ?Neg liver ROS, GERD  Medicated,  ?Endo/Other  ?diabetes, GestationalMorbid obesity ? Renal/GU ?negative Renal ROS  ?negative genitourinary ?  ?Musculoskeletal ?negative musculoskeletal ROS ?(+)  ? Abdominal ?  ?Peds ?negative pediatric ROS ?(+)  Hematology ?negative hematology ROS ?(+)   ?Anesthesia Other Findings ? ? Reproductive/Obstetrics ?negative OB ROS ? ?  ? ? ? ? ? ? ? ? ? ? ? ? ? ?  ?  ? ? ? ? ? ? ? ?Anesthesia Physical ?Anesthesia Plan ? ?ASA: 3 ? ?Anesthesia Plan: Spinal  ? ?Post-op Pain Management:   ? ?Induction:  ? ?PONV Risk Score and Plan: 2 and Treatment may vary due to age or medical condition ? ?Airway Management Planned: Natural Airway and Simple Face Mask ? ?Additional Equipment:  ? ?Intra-op Plan:  ? ?Post-operative Plan:  ? ?Informed Consent: I have reviewed the patients History and Physical, chart, labs and discussed the procedure including the risks, benefits and alternatives for the proposed anesthesia with the patient or authorized representative who has indicated his/her understanding and acceptance.  ? ? ? ?Dental advisory given ? ?Plan Discussed with: CRNA and Anesthesiologist ? ?Anesthesia Plan Comments: (Spinal. GETA as backup. Norton Blizzard, MD  ?)  ? ? ? ? ? ?Anesthesia Quick Evaluation ? ?

## 2021-11-12 NOTE — Lactation Note (Signed)
This note was copied from a baby's chart. ? ?NICU Lactation Consultation Note ? ?Patient Name: Dana Downs ?Today's Date: 11/12/2021 ?Age:41 years ? ? ?Subjective ?Reason for consult: Initial assessment; NICU baby; Term ? ?Lactation conducted initial consult with Ms. Cermak. She just finished pumping for the first time with the help of her RN. Droplets noted. We discussed oral care, and I educated on pumping basics. I encouraged her to track her pumping sessions. ? ?Ms. Kunde's last breast feeding experience was 10 years ago. She reports a poor experience with breast feeding. She had difficulty latching baby and lack of education and support. She states that she is eager to hold baby "Dana Downs."  ? ?Ms. Shumard requested a hands free pumping "bra". There were no options available on her floor, and I agreed to tube some down for her. ? ?Objective ?Infant data: ?Mother's Current Feeding Choice: Breast Milk ? ?Infant feeding assessment ?Scale for Readiness: 2 (PO attempted, but infant fell asleep and never latched to nipple) ? ?  ?Maternal data: ?[id number]  ?C-Section, Low Transverse ? ?Current breast feeding challenges:: NICU- separation ? ?Previous breastfeeding challenges?: Lack of support; Other (Comment); Low milk supply (difficulty latching first child) ? ?Does the patient have breastfeeding experience prior to this delivery?: Yes ?How long did the patient breastfeed?: briefly - 10 years ago ? ?Pumping frequency: recommended 8 times a day; q2-3 hours during the day and q3-4 hours during the night ?Pumped volume: 1 mL (droplets) ? ? ?Pump: Personal (Motif) ? ?Assessment ? ?Maternal: ?Milk volume: Normal ? ? ?Intervention/Plan ?Interventions: Breast feeding basics reviewed; Education; "The NICU and Your Baby" book; [facility] brochure ? ?Tools: Pump ?Pump Education: Setup, frequency, and cleaning ? ?Plan: ?Consult Status: Follow-up ? ?NICU Follow-up type: New admission follow up ? ? ? ?Lenore Manner ?11/12/2021, 5:51  PM ?

## 2021-11-12 NOTE — Brief Op Note (Signed)
11/12/2021 ? ?12:35 PM ? ?PATIENT:  Dana Downs  41 y.o. female ? ?PRE-OPERATIVE DIAGNOSIS:  repeat cesarean section and bilateral tubal ligation ? ?POST-OPERATIVE DIAGNOSIS:  repeat cesarean section and bilateral tubal ligation ? ?PROCEDURE:  Procedure(s): ?REPEAT CESAREAN SECTION WITH BILATERAL TUBAL LIGATION EDC: 12-03-21 ALLERG: BLEACH, DOXYCYCLINE, PENICILLINS, SULFA (Bilateral) ? ?SURGEON:  Surgeon(s) and Role: ?   Everlene Farrier, MD - Primary ? ?PHYSICIAN ASSISTANT:  ? ?ASSISTANTS: none  ? ?ANESTHESIA:   spinal ? ?EBL:  286m  ? ?BLOOD ADMINISTERED:none ? ?DRAINS: Urinary Catheter (Foley)  ? ?LOCAL MEDICATIONS USED:  NONE ? ?SPECIMEN:  Source of Specimen:  placenta, bilateral fallopian tube segments ? ?DISPOSITION OF SPECIMEN:  PATHOLOGY ? ?COUNTS:  YES ? ?TOURNIQUET:  * No tourniquets in log * ? ?DICTATION: .Other Dictation: Dictation Number [id number]? ?PLAN OF CARE: Admit to inpatient  ? ?PATIENT DISPOSITION:  PACU - hemodynamically stable. ?  ?Delay start of Pharmacological VTE agent (>24hrs) due to surgical blood loss or risk of bleeding: not applicable ? ?

## 2021-11-12 NOTE — Anesthesia Postprocedure Evaluation (Signed)
Anesthesia Post Note ? ?Patient: AVELYN TOUCH ? ?Procedure(s) Performed: REPEAT CESAREAN SECTION WITH BILATERAL TUBAL LIGATION EDC: 12-03-21 ALLERG: BLEACH, DOXYCYCLINE, PENICILLINS, SULFA (Bilateral) ? ?  ? ?Patient location during evaluation: PACU ?Anesthesia Type: Spinal ?Level of consciousness: oriented and awake and alert ?Pain management: pain level controlled ?Vital Signs Assessment: post-procedure vital signs reviewed and stable ?Respiratory status: spontaneous breathing and respiratory function stable ?Cardiovascular status: blood pressure returned to baseline and stable ?Postop Assessment: no headache, no backache, no apparent nausea or vomiting and spinal receding ?Anesthetic complications: no ? ? ?No notable events documented. ? ?Last Vitals:  ?Vitals:  ? 11/12/21 0911 11/12/21 1245  ?BP: (!) 144/101 132/87  ?Pulse: 60 (!) 53  ?Resp: 20 14  ?Temp: 36.6 ?C 36.4 ?C  ?SpO2: 100% 100%  ?  ?Last Pain:  ?Vitals:  ? 11/12/21 1245  ?TempSrc:   ?PainSc: 0-No pain  ? ?Pain Goal:   ? ?  ?  ?  ?  ?  ?  ?  ? ?Merlinda Frederick ? ? ? ? ?

## 2021-11-12 NOTE — H&P (Signed)
Dana Downs is a 41 y.o. female presenting for repeat cesarean section with bilateral tubal ligation. Pregnancy complicated by pregnancy induced hypertension. BPs labile as high as  148/102 and 152/200 in the office. No HA or vision change. Labs have been normal. Gestational DM with good control on diet. Thyroid nodule with normal TFT.  IBS. Previous cesarean section for repeat. ?OB History   ? ? Gravida  ?2  ? Para  ?1  ? Term  ?1  ? Preterm  ?   ? AB  ?   ? Living  ?1  ?  ? ? SAB  ?   ? IAB  ?   ? Ectopic  ?   ? Multiple  ?   ? Live Births  ?1  ?   ?  ?  ? ?Past Medical History:  ?Diagnosis Date  ? Abnormal Pap smear   ? Complication of anesthesia   ? took 4 hours for legs to wake up with last CS  ? Constipation, chronic   ? Dysplasia   ? GERD (gastroesophageal reflux disease)   ? heartburn with pregnancy-occasional  ? Gestational diabetes   ? Headache(784.0)   ? HPV in female   ? Numbness   ? right thumb  ? Pins and needles sensation   ? right arm  ? Polycystic ovarian syndrome   ? ?Past Surgical History:  ?Procedure Laterality Date  ? CESAREAN SECTION  09/02/2011  ? Procedure: CESAREAN SECTION;  Surgeon: Marylynn Pearson, MD;  Location: [facility];  Service: Gynecology;  Laterality: N/A;  Primary cesarean section  ? CHOLECYSTECTOMY  2013  ? NECK SURGERY  age 57-gland removal  ? atypical microbacterial infection  ? TOOTH EXTRACTION    ? ?Family History: family history includes COPD in her paternal grandfather; Cancer in her paternal grandmother; Diabetes in her father and paternal grandmother; Heart attack in her paternal grandfather; Hyperlipidemia in her father; Hypertension in her mother; Multiple sclerosis in her mother. ?Social History:  reports that she quit smoking about 12 years ago. Her smoking use included cigarettes. She has never used smokeless tobacco. She reports that she does not drink alcohol and does not use drugs. ? ? ?  ?Maternal Diabetes: Yes:  Diabetes Type:  Diet controlled ?Genetic Screening:  Normal ?Maternal Ultrasounds/Referrals: Normal ?Fetal Ultrasounds or other Referrals:  None ?Maternal Substance Abuse:  No ?Significant Maternal Medications:  None ?Significant Maternal Lab Results:  Group B Strep negative ?Other Comments:  None ? ?Review of Systems  ?Constitutional:  Negative for fever.  ?Eyes:  Negative for visual disturbance.  ?Gastrointestinal:  Negative for abdominal pain.  ?Neurological:  Negative for headaches.  ?Maternal Medical History:  ?Fetal activity: Perceived fetal activity is normal.   ? ?  ?There were no vitals taken for this visit. ?Maternal Exam:  ?Abdomen: Fetal presentation: vertex ? ?Physical Exam ?Cardiovascular:  ?   Rate and Rhythm: Normal rate.  ?Pulmonary:  ?   Effort: Pulmonary effort is normal.  ?  ?Prenatal labs: ?ABO, Rh: --/--/O POS (04/07 4097) ?Antibody: NEG (04/07 3532) ?Rubella: Immune (09/28 0000) ?RPR: NON REACTIVE (04/07 0936)  ?HBsAg: Negative (09/28 0000)  ?HIV: Non-reactive (09/28 0000)  ?GBS:   negative  11/05/21 ? ?Assessment/Plan: ?41 yo G 2P1 ?Pregnancy induced hypertension ?A1GDM ?Previous cesarean section and desires repeat with BTL ?D/W repeat cesarean section and bilateral tubal ligation. D/W risks including infection, organ damage, bleeding/transfusion-HIV/Hep, DVT/PE, pneumonia, wound breakdown. D/W BTL and permanence, failure rate and increased ectopic risks. She  states she understands and agrees. ? ?Shon Millet II ?11/12/2021, 7:11 AM ? ? ? ? ?

## 2021-11-12 NOTE — Op Note (Signed)
NAME: Dana Downs, Dana Downs. ?MEDICAL RECORD NO: [id number] ?ACCOUNT NO: [id number] ?DATE OF BIRTH: [birth year] ?FACILITY: [facility] ?LOCATION: [facility] ?PHYSICIAN: Daleen Bo. Lyn Hollingshead, MD ? ?Operative Report  ? ?DATE OF PROCEDURE: 11/12/2021 ? ? ?PREOPERATIVE DIAGNOSES:   ?1.  Intrauterine pregnancy at 37 and 0/7th weeks. ?2.  Pregnancy-induced hypertension. ?3.  Desires repeat cesarean section. ?4.  Desires permanent sterilization. ? ?POSTOPERATIVE DIAGNOSES:   ?1.  Intrauterine pregnancy at 37 and 0/7th weeks. ?2.  Preeclampsia. ?3.  Previous cesarean section, desires repeat. ?4.  Desires permanent sterilization. ? ?PROCEDURE:   ?1.  Repeat low transverse cesarean section. ?2.  Bilateral tubal ligation. ? ?SURGEON:  Everlene Farrier, II, MD ? ?ANESTHESIA:  Spinal. ? ?ESTIMATED BLOOD LOSS:  237 mL ? ?SPECIMENS:  Placenta and bilateral fallopian tube segments to pathology. ? ?FINDINGS:  Viable female infant.  Apgars, birth weight, arterial cord pH pending. ? ?INDICATIONS AND CONSENT:  This patient is a 41 year old G2, P1 at 32 and 0/7 weeks.  Her blood pressures have become increasingly labile as high as in the 150s/100s.  On the day of surgery, she woke with a mild headache, although it had resolved in the  ?preoperative area.  Her blood pressures were 150s/100s on presentation.   ?Procedure was reviewed and potential risks and complications were discussed preoperatively including but not limited to infection, organ damage, bleeding requiring transfusion of blood products with HIV and hepatitis acquisition, DVT, PE, pneumonia and  ?wound breakdown.  Permanence of tubal ligation, failure risk and increased ectopic risk were also discussed.  She states she understands and agrees and consent was signed on the chart. ? ?DESCRIPTION OF PROCEDURE:  The patient was taken to the operating room where she was identified.  Spinal anesthetic was placed per anesthesiology and she was placed in the dorsal supine position with a 15-degree left lateral  wedge.  She had severe range  ?blood pressures on presentation to the operating room.  She was prepped vaginally with Betadine.  Foley catheter was placed and she was prepped abdominally with ChloraPrep.  Timeout was done.  After 3-minute drying time, she was draped in a sterile  ?fashion.  After testing for adequate spinal anesthesia skin was entered through a Pfannenstiel incision, taking out the old scar on the way in.  Dissection was carried out in layers to the peritoneum, which was taken down superiorly and inferiorly.   ?Vesicouterine peritoneum was taken down.  Bladder flap developed.  Bladder blade was placed.  Uterus was incised in a low transverse manner and the uterine cavity was entered bluntly with a hemostat.  Clear fluid was noted.  Incision was extended with  ?the fingers.  Baby was delivered from the vertex position.  Single nuchal cord was reduced.  Baby was delivered and good cry and tone is noted.  After 1 minute, cord was clamped and cut and the baby was handed to waiting pediatrics team.  Placenta was  ?delivered with uterine massage.  Cavity is clean.  Uterus was closed in 2 running locking imbricating layers of 0 Monocryl suture, which achieved good hemostasis.  The left fallopian tube was identified from cornu to fimbria, grasped in its mid ampullary ? portion with a Babcock clamp and a knuckle of tube was doubly ligated with 2 free ties of plain suture.  The intervening knuckle was sharply resected and cautery was used to assure hemostasis.  Similar procedure was carried out on the right side.   ?Lavage was carried out and all returned  as clear.  Anterior peritoneum was closed in running fashion with 0 Monocryl suture, which was also used to reapproximate the pyramidalis muscle in the midline.  Anterior rectus fascia was closed in a running  ?fashion with a 0 PDS.  Subcutaneous layer was closed with interrupted plain and the skin was closed in a subcuticular fashion with a 4-0 Vicryl on a  Keith needle.  Benzoin, Steri-Strips, honeycomb and pressure dressing was applied.  All counts were  ?correct.  The patient was taken to recovery room in stable condition.  ? ? ? ?[place] ?D: 11/12/2021 12:41:53 pm T: 11/12/2021 11:23:00 pm  ?JOB: [id number]/ [id number]  ?

## 2021-11-12 NOTE — Progress Notes (Signed)
Had mild HA this am that is now gone. No vision changes, no abdominal pain. ? ?D/W procedure-cesarean section and bilateral tubal ligation.  ?All questions anwered ?She states she understands and agrees ?Will check labs now ? ?

## 2021-11-12 NOTE — Transfer of Care (Signed)
Immediate Anesthesia Transfer of Care Note ? ?Patient: Dana Downs ? ?Procedure(s) Performed: REPEAT CESAREAN SECTION WITH BILATERAL TUBAL LIGATION EDC: 12-03-21 ALLERG: BLEACH, DOXYCYCLINE, PENICILLINS, SULFA (Bilateral) ? ?Patient Location: PACU ? ?Anesthesia Type:Spinal ? ?Level of Consciousness: awake ? ?Airway & Oxygen Therapy: Patient Spontanous Breathing ? ?Post-op Assessment: Report given to RN and Post -op Vital signs reviewed and stable ? ?Post vital signs: Reviewed and stable ? ?Last Vitals:  ?Vitals Value Taken Time  ?BP    ?Temp    ?Pulse    ?Resp    ?SpO2    ? ? ?Last Pain:  ?Vitals:  ? 11/12/21 0911  ?TempSrc: Oral  ?   ? ?  ? ?Complications: No notable events documented. ?

## 2021-11-13 LAB — CBC
HCT: 29.4 % — ABNORMAL LOW (ref 36.0–46.0)
Hemoglobin: 10.3 g/dL — ABNORMAL LOW (ref 12.0–15.0)
MCH: 32.7 pg (ref 26.0–34.0)
MCHC: 35 g/dL (ref 30.0–36.0)
MCV: 93.3 fL (ref 80.0–100.0)
Platelets: 189 10*3/uL (ref 150–400)
RBC: 3.15 MIL/uL — ABNORMAL LOW (ref 3.87–5.11)
RDW: 13.2 % (ref 11.5–15.5)
WBC: 11 10*3/uL — ABNORMAL HIGH (ref 4.0–10.5)
nRBC: 0 % (ref 0.0–0.2)

## 2021-11-13 LAB — SURGICAL PATHOLOGY

## 2021-11-13 MED ORDER — POLYETHYLENE GLYCOL 3350 17 G PO PACK
17.0000 g | PACK | Freq: Every day | ORAL | Status: DC
  Administered 2021-11-13 – 2021-11-16 (×4): 17 g via ORAL
  Filled 2021-11-13 (×4): qty 1

## 2021-11-13 NOTE — Progress Notes (Signed)
Postpartum Progress Note ? ?Postpartum Day 1 s/p repeat Cesarean section, tubal ligation at 37 weeks for PIH. ? ?Subjective: ? ?Patient reports no overnight events.  She reports well controlled pain, ambulating without difficulty, voiding spontaneously, tolerating PO.  She reports Positive flatus, Negative BM.  Vaginal bleeding is minimal.  Headache is mild, intermittent but improved since delivery.  She noted some blurriness overnight with magnesium.  ? ?Objective: ?Blood pressure 113/70, pulse 64, temperature 97.9 ?F (36.6 ?C), temperature source Oral, resp. rate 16, height '5\' 9"'$  (1.753 m), weight 105.7 kg, SpO2 99 %, unknown if currently breastfeeding. ? ?Physical Exam:  ?General: alert and no distress ?Lochia: appropriate ?Uterine Fundus: firm ?Incision: dressing in place ?DVT Evaluation: No evidence of DVT seen on physical exam. ? ?Recent Labs  ?  11/12/21 ?1657 11/13/21 ?0424  ?HGB 13.0 10.3*  ?HCT 36.4 29.4*  ? ? ?Assessment/Plan: ?Postpartum Day 1, s/p C-section ?PP magnesium due for discontinuation 1147 ?Baby boy in NICU ?A1GDM - BG well controlled perioperatively ?Lactation following ?Continue routine postpartum care.  Discussed anticipated improvement after magnesium is discontinued, will encourage ambulation as able.  ? ? LOS: 1 day  ? ?Carlyon Shadow ?11/13/2021, 7:25 AM  ? ? ?

## 2021-11-13 NOTE — Lactation Note (Signed)
This note was copied from a baby's chart. ? ?NICU Lactation Consultation Note ? ?Patient Name: Dana Downs ?Today's Date: 11/13/2021 ?Age:40 hours ? ? ?Subjective ?Reason for consult: Follow-up assessment ?Mother pumped 2 x yesterday. She plans to increase pumping frequency when Mag is stopped later today. We reviewed previously provided ed. Mother is aware of [place] services during NICU stay. ? ?Objective ?Infant data: ?Mother's Current Feeding Choice: Breast Milk and Donor Milk ? ?Infant feeding assessment ?Scale for Readiness: 3 ? ?Maternal data: ?[id number]  ?C-Section, Low Transverse ? ?Current breast feeding challenges:: NICU- separation ? ?Previous breastfeeding challenges?: Lack of support; Other (Comment); Low milk supply (difficulty latching first child) ? ?Does the patient have breastfeeding experience prior to this delivery?: Yes ?How long did the patient breastfeed?: briefly - 10 years ago ? ?Pumping frequency: recommended 8 times a day; q2-3 hours during the day and q3-4 hours during the night ?Pumped volume: 1 mL (droplets) ? ?Pump: Personal (Motif) ? ?Assessment ?Maternal: ?Milk volume: Normal ? ? ?Intervention/Plan ?Interventions: Education ? ?Tools: Pump ?Pump Education: Setup, frequency, and cleaning ? ?Plan: ?Consult Status: Follow-up ? ?NICU Follow-up type: Maternal D/C visit; Verify onset of copious milk; Verify absence of engorgement ? ?Mother to pump q3 ? ? ?Gwynne Edinger ?11/13/2021, 10:35 AM ?

## 2021-11-13 NOTE — Progress Notes (Signed)
Patient screened out for psychosocial assessment since none of the following apply: °Psychosocial stressors documented in mother or baby's chart °Gestation less than 32 weeks °Code at delivery  °Infant with anomalies °Please contact the Clinical Social Worker if specific needs arise, by MOB's request, or if MOB scores greater than 9/yes to question 10 on Edinburgh Postpartum Depression Screen. ° °Rynn Markiewicz Boyd-Gilyard, MSW, LCSW °Clinical Social Work °[phone] °  °

## 2021-11-14 MED ORDER — TRAMADOL HCL 50 MG PO TABS
50.0000 mg | ORAL_TABLET | Freq: Four times a day (QID) | ORAL | Status: DC | PRN
Start: 2021-11-14 — End: 2021-11-16
  Administered 2021-11-14 – 2021-11-16 (×9): 50 mg via ORAL
  Filled 2021-11-14 (×9): qty 1

## 2021-11-14 NOTE — Addendum Note (Signed)
Addendum  created 11/14/21 0700 by Merlinda Frederick, MD  ? Child order released for a procedure order, Clinical Note Signed, Intraprocedure Blocks edited, SmartForm saved  ?  ?

## 2021-11-14 NOTE — Anesthesia Procedure Notes (Signed)
Spinal ? ?Patient location during procedure: OR ?Start time: 11/14/2021 11:15 AM ?End time: 11/14/2021 11:20 AM ?Reason for block: surgical anesthesia ?Staffing ?Performed: anesthesiologist  ?Anesthesiologist: Merlinda Frederick, MD ?Preanesthetic Checklist ?Completed: patient identified, IV checked, risks and benefits discussed, surgical consent, monitors and equipment checked, pre-op evaluation and timeout performed ?Spinal Block ?Patient position: sitting ?Prep: DuraPrep ?Patient monitoring: cardiac monitor, continuous pulse ox and blood pressure ?Approach: midline ?Location: L3-4 ?Injection technique: single-shot ?Needle ?Needle type: Pencan  ?Needle gauge: 24 G ?Needle length: 9 cm ?Assessment ?Events: CSF return ?Additional Notes ?Functioning IV was confirmed and monitors were applied. Sterile prep and drape, including hand hygiene and sterile gloves were used. The patient was positioned and the spine was prepped. The skin was anesthetized with lidocaine.  Free flow of clear CSF was obtained prior to injecting local anesthetic into the CSF.  The spinal needle aspirated freely following injection.  The needle was carefully withdrawn.  The patient tolerated the procedure well.  ? ? ? ?

## 2021-11-14 NOTE — Progress Notes (Signed)
POD # 2  ? ?S: Baby in NICU. Patient reports incision is more painful and swollen. ? ?O:  BP 124/71 (BP Location: Right Arm)   Pulse 82   Temp 98.3 ?F (36.8 ?C) (Oral)   Resp 18   Ht '5\' 9"'$  (1.753 m)   Wt 105.7 kg   SpO2 98%   Breastfeeding Unknown   BMI 34.41 kg/m?  ?No results found for this or any previous visit (from the past 24 hour(s)). ?Abdomen dressing removed ?Incision - steri strips saturated with blood ?With pressure and no bleeding noted  ?However there is bruising superior aspect of incision all the way across and bruising extending down the mons. ?Feels firm all the way across ?small hematoma ?Nurse and I examined patient together  ?Honeycomb reapplied and pressure dressing applied ? ?A/P:   ? ?POD # 2  ?Preeclampsia  ?Incisional hematoma ?Discussed with patient ?Start ultram every 6 hours  ?Recommend ice packs to incision today  ?Discussed with patient  ?Reevaluate incision tomorrow on rounds ?

## 2021-11-15 NOTE — Lactation Note (Signed)
This note was copied from a baby's chart. ?Lactation Consultation Note ? ?Patient Name: Dana Downs ?Today's Date: 11/15/2021 ?Reason for consult: Early term 12-38.6wks;Follow-up assessment;Infant weight loss ?Age:40 hours ? ?LC in to visit with P2 (first baby is 80 yrs old) Mom of 24w0dbaby.  Baby transitioned from NICU for respiratory support.  Baby is at 8.2% weight loss today with adequate output. ? ?Baby just fed 12 ml donor breast milk and was still cueing.  RN doing assessment on baby.  Offered to assist with baby latching to the breast.  Reviewed breast massage and hand expression, drop of colostrum noted. ? ?LC noted a U-shaped tongue when baby crying.  Swipe of finger under tongue identified a short anterior lingual frenulum, labial frenulum to gum line and arched palate.  Mom states her first baby had a tongue restriction and Mom has a lisp. ? ?Baby positioned in football hold on left breast.  After a few attempts, baby able to attain a deeper latch to breast.  Mom denies discomfort with latch.  Baby sucking with deep jaw extensions, an occasional swallow noted.  Mom taught to use breast compression during feeding.  Mom encouraged not to pull breast away from baby's nose, but to bring chin into breast. ? ?Assisted Mom to double pump on initiation setting.  24 flanges appear to be a good fit.  Provided a washing and drying bin and instructed on proper cleaning of pump parts. ?Talked to Mom about the importance of lactation f/u after discharge. ? ?Plan- ?1- Keep baby STS as much as possible ?2- Offer breast at least every 3 hrs, sooner with cues ?3- supplement per guidelines of 20-30+ ml EBM+/donor breast milk by paced bottle ?4- Pump both breasts 15 mins on initiation setting ? ? ?Feeding ?Nipple Type: Nfant Extra Slow Flow (gold) ? ?LATCH Score ?Latch: Repeated attempts needed to sustain latch, nipple held in mouth throughout feeding, stimulation needed to elicit sucking reflex. ? ?Audible  Swallowing: A few with stimulation ? ?Type of Nipple: Everted at rest and after stimulation ? ?Comfort (Breast/Nipple): Soft / non-tender ? ?Hold (Positioning): Assistance needed to correctly position infant at breast and maintain latch. ? ?LATCH Score: 7 ? ? ?Lactation Tools Discussed/Used ?Tools: Pump;Flanges;Bottle ?Flange Size: 24 ?Breast pump type: Double-Electric Breast Pump ?Pump Education: Setup, frequency, and cleaning;Milk Storage ?Reason for Pumping: Support milk supply/ET infant transitioned from NICU/weight loss of 8.2% ?Pumping frequency: Mom encouraged to pump every 2-3 hrs today and 3-4 hrs at night (Mom states she has pumped 3 times total) ?Pumped volume: 0 mL (drops) ? ?Interventions ?Interventions: Breast feeding basics reviewed;Skin to skin;Breast massage;Assisted with latch;Hand express;Breast compression;Adjust position;Support pillows;Position options;DEBP;Coconut oil;Pace feeding ? ?Discharge ?Pump: Personal (Motif Luna) ? ?Consult Status ?Consult Status: Follow-up ?Date: 11/16/21 ?Follow-up type: In-patient ? ? ? ?STilda BurrowE ?11/15/2021, 9:17 AM ? ? ? ?

## 2021-11-15 NOTE — Progress Notes (Signed)
Subjective: ?Postpartum Day 3: Cesarean Delivery ?Patient reports incisional pain, tolerating PO, + flatus, and no problems voiding.   ? ?Objective: ?Vital signs in last 24 hours: ?Temp:  [97.3 ?F (36.3 ?C)-98.5 ?F (36.9 ?C)] 98.1 ?F (36.7 ?C) (04/13 0754) ?Pulse Rate:  [70-91] 70 (04/13 0754) ?Resp:  [16-18] 18 (04/13 0754) ?BP: (116-141)/(66-83) 122/73 (04/13 0754) ?SpO2:  [99 %-100 %] 99 % (04/13 0754) ? ?Physical Exam:  ?General: alert, cooperative, appears stated age, and mild distress ?Lochia: appropriate ?Uterine Fundus: firm ?Incision: healing well, no significant drainage, marked contusion above and below incision into mons.  No palpable masses  c/w hematoma. ?DVT Evaluation: No evidence of DVT seen on physical exam. ? ?Recent Labs  ?  11/12/21 ?1657 11/13/21 ?0424  ?HGB 13.0 10.3*  ?HCT 36.4 29.4*  ? ? ?Assessment/Plan: ?Status post Cesarean section. Postoperative course complicated by Baby now out of NICU and in room   ?Continue current care ?Plan circumcision today ?Incisional contusion improving.  No draining from incision. Will continue to monitor.. ? ?Luz Lex ?11/15/2021, 9:23 AM ? ? ?

## 2021-11-16 MED ORDER — IBUPROFEN 600 MG PO TABS: 600.0000 mg | ORAL_TABLET | Freq: Four times a day (QID) | ORAL | 0 refills | Status: DC | PRN

## 2021-11-16 MED ORDER — TRAMADOL HCL 50 MG PO TABS: 50.0000 mg | ORAL_TABLET | Freq: Four times a day (QID) | ORAL | 0 refills | Status: DC | PRN

## 2021-11-16 NOTE — Plan of Care (Signed)
Patient to be discharged home with printed instructions. No concerns noted. Toya Smothers, RN  ?

## 2021-11-16 NOTE — Progress Notes (Signed)
Subjective: ?Postpartum Day 4: Cesarean Delivery ?Patient reports tolerating PO, + flatus, + BM, and no problems voiding.   ? ?Objective: ?Vital signs in last 24 hours: ?Temp:  [97.7 ?F (36.5 ?C)-98.6 ?F (37 ?C)] 97.9 ?F (36.6 ?C) (04/14 0725) ?Pulse Rate:  [65-88] 71 (04/14 0725) ?Resp:  [16-18] 16 (04/14 0725) ?BP: (117-136)/(53-75) 122/59 (04/14 0725) ?SpO2:  [99 %-100 %] 100 % (04/14 0400) ? ?Physical Exam:  ?General: alert, cooperative, and no distress ?Lochia: appropriate ?Uterine Fundus: firm ?Incision: incisional contusion evolving. No induration, no palpable fluid collection, no drainage. Contusion around incision onto mons and some toward left flank. Incision healing well ?DVT Evaluation: No evidence of DVT seen on physical exam. ? ?No results for input(s): HGB, HCT in the last 72 hours. ? ?Assessment/Plan: ?Status post Cesarean section. Doing well postoperatively.  ?Discharge home. ?Instructions reviewed ?FU office 1-2 weeks for BP and incision check ? ?Dana Downs ?11/16/2021, 8:30 AM ? ? ?

## 2021-11-16 NOTE — Lactation Note (Signed)
This note was copied from a baby's chart. ?Lactation Consultation Note ? ?Patient Name: Dana Downs ?Today's Date: 11/16/2021 ?Reason for consult: Follow-up assessment;Mother's request;Difficult latch;Exclusive pumping and bottle feeding;Early term 37-38.6wks;Infant weight loss;Maternal endocrine disorder;Breastfeeding assistance ?Age:40 days ? ?Mom mostly offer DBM in a bottle 20-30 ml per feeding. LC reviewed feeding volumes based on hrs of age. Volume supplementation guide provided to increase volume per feeding as tolerated to 28-42 ml if latching, more if not going to the breast.  ? ?Plan 1. To feed based on cues 8-12x 24hr period. Mom to offer breast with next feeding and call for latch assistance  ?2. Mom to supplement with EBM first followed by [facility] with pace bottle feeding and slow flow nipple.  ?3. DEBP q 3hrs for 15 min  ?All questions answered at the end of the visit.  ? ?Infant 3 voids no stool today so far. Adequate urine and stool output yesterday.  ?Infant gained 21 grams today.  ? ?Maternal Data ?Has patient been taught Hand Expression?: Yes ? ?Feeding ?Nipple Type: Slow - flow ? ?LATCH Score ?  ? ?  ? ?  ? ?  ? ?  ? ?  ? ? ?Lactation Tools Discussed/Used ?Flange Size: 27 ?Breast pump type: Double-Electric Breast Pump (Mom complaining of nipple pain, we increased flange size to 27 and lowered pump setting, stated felt better and milk started to flow. coconut oil applied to breast with massage to releive areas of discomfort. breast softened in areas mom stated were hard) ?Pump Education: Setup, frequency, and cleaning;Milk Storage ?Reason for Pumping: increase stimulation ?Pumping frequency: every 3 hrs for 15 min ? ?Interventions ?Interventions: Skin to skin;Breast feeding basics reviewed;Breast compression;Position options;Expressed milk;DEBP;Pace feeding;Education;LC Magazine features editor;Infant Driven Feeding Algorithm education ? ?Discharge ?Discharge Education: Engorgement and breast care;Warning  signs for feeding baby;Outpatient recommendation;Outpatient Epic message sent ?Pump: Personal ? ?Consult Status ?Consult Status: Complete ?Date: 11/16/21 ? ? ? ?Amanda Steuart  Nicholson-Springer ?11/16/2021, 12:19 PM ? ? ? ?

## 2021-11-16 NOTE — Discharge Summary (Signed)
? ?  Postpartum Discharge Summary ? ?Date of Service updated 11/16/21 ? ?   ?Patient Name: Dana Downs ?DOB: 1981/06/23 ?MRN: [id number] ? ?Date of admission: 11/12/2021 ?Delivery date:11/12/2021  ?Delivering provider: Everlene Farrier  ?Date of discharge: 11/16/2021 ? ?Admitting diagnosis: Term pregnancy [Z34.90] ?Intrauterine pregnancy: [redacted]w[redacted]d    ?Secondary diagnosis:  Principal Problem: ?  Term pregnancy ? ?Additional problems: incision contusion    ?Discharge diagnosis: Term Pregnancy Delivered, Gestational Hypertension, and GDM A1   preeclampsia                                           ?Post partum procedures: ?Augmentation: N/A ?Complications: None ? ?Hospital course: Sceduled C/S   41y.o. yo G2P2002 at 320w0das admitted to the hospital 11/12/2021 for scheduled cesarean section with the following indication:Elective Repeat.Delivery details are as follows:  ?Membrane Rupture Time/Date: 11:46 AM ,11/12/2021   ?Delivery Method:C-Section, Low Transverse  ?Details of operation can be found in separate operative note.  Patient had an uncomplicated postpartum course.  She is ambulating, tolerating a regular diet, passing flatus, and urinating well. Patient is discharged home in stable condition on  11/16/21 ?       ?Newborn Data: ?Birth date:11/12/2021  ?Birth time:11:47 AM  ?Gender:Female  ?Living status:Living  ?Apgars:8 ,8  ?Weight:2960 g    ? ?Magnesium Sulfate received: Yes: Seizure prophylaxis ?BMZ received: No ?Rhophylac:No ?MMR:No ?T-DaP:Given prenatally ?Flu: No ?Transfusion:No ? ?Physical exam  ?Vitals:  ? 11/15/21 1923 11/15/21 2329 11/16/21 0400 11/16/21 0725  ?BP: 131/75 136/70 (!) 117/53 (!) 122/59  ?Pulse: 77 72 65 71  ?Resp: 16 18 17 16   ?Temp: 98.6 ?F (37 ?C) 97.7 ?F (36.5 ?C) 98.1 ?F (36.7 ?C) 97.9 ?F (36.6 ?C)  ?TempSrc: Oral Oral Oral   ?SpO2: 100% 100% 100%   ?Weight:      ?Height:      ? ?General: alert, cooperative, and no distress ?Lochia: appropriate ?Uterine Fundus: firm ?Incision: incision  contusion ?DVT Evaluation: No evidence of DVT seen on physical exam. ?Labs: ?Lab Results  ?Component Value Date  ? WBC 11.0 (H) 11/13/2021  ? HGB 10.3 (L) 11/13/2021  ? HCT 29.4 (L) 11/13/2021  ? MCV 93.3 11/13/2021  ? PLT 189 11/13/2021  ? ? ?  Latest Ref Rng & Units 11/12/2021  ?  4:57 PM  ?CMP  ?Glucose 70 - 99 mg/dL 128    ?BUN 6 - 20 mg/dL 10    ?Creatinine 0.44 - 1.00 mg/dL 0.91    ?Sodium 135 - 145 mmol/L 132    ?Potassium 3.5 - 5.1 mmol/L 3.9    ?Chloride 98 - 111 mmol/L 103    ?CO2 22 - 32 mmol/L 19    ?Calcium 8.9 - 10.3 mg/dL 8.1    ?Total Protein 6.5 - 8.1 g/dL 5.6    ?Total Bilirubin 0.3 - 1.2 mg/dL 0.5    ?Alkaline Phos 38 - 126 U/L 96    ?AST 15 - 41 U/L 24    ?ALT 0 - 44 U/L 17    ? ?Edinburgh Score: ?   ? View : No data to display.  ?  ?  ?  ? ? ? ? ?After visit meds:  ? ? ? ?Discharge home in stable condition ?Infant Feeding: Breast ?Infant Disposition:home with mother ?Discharge instruction: per After Visit Summary and Postpartum booklet. ?Activity: Advance as tolerated.  Pelvic rest for 6 weeks.  ?Diet: routine diet ?Anticipated Birth Control:  BTL ?Postpartum Appointment:1 week ?Additional Postpartum F/U:  ?Future Appointments:No future appointments. ?Follow up Visit: ? ? ?  ? ?11/16/2021 ?Shon Millet II, MD ? ? ?

## 2021-11-23 ENCOUNTER — Telehealth (HOSPITAL_COMMUNITY): Payer: Self-pay | Admitting: *Deleted

## 2021-11-23 NOTE — Telephone Encounter (Signed)
Left phone voicemail message. ? ?Odis Hollingshead, RN 11-23-2021 at 1:55pm ?

## 2022-02-19 ENCOUNTER — Encounter: Payer: Self-pay | Admitting: Family Medicine

## 2022-02-19 NOTE — Telephone Encounter (Signed)
Nurses Please connect with Claib[facility] Billings The treatment is diltiazem 2% compounded for anal fissures [employer] can compound this Sometimes it is covered by insurance other times its out-of-pocket Please have [employer] compound it and give her the standard amount for her to use 4-5 times per day on the anal fissure till healed If not dramatically better within 1 week let us know Utilize stool softeners keep bowel movements soft If any ongoing troubles let us know

## 2022-02-20 ENCOUNTER — Other Ambulatory Visit: Payer: Self-pay

## 2022-02-20 DIAGNOSIS — K602 Anal fissure, unspecified: Secondary | ICD-10-CM

## 2022-02-20 MED ORDER — DILTIAZEM GEL 2 %: CUTANEOUS | 1 refills | Status: DC

## 2022-03-25 ENCOUNTER — Ambulatory Visit: Payer: Self-pay | Admitting: General Surgery

## 2022-03-25 NOTE — H&P (Signed)
REFERRING PHYSICIAN:  Jake Samples, MD  PROVIDER:  Monico Blitz, MD  MRN: [id number] DOB: 24-Feb-1981 DATE OF ENCOUNTER: 03/25/2022  Subjective   Chief Complaint: Rectal Pain     History of Present Illness: Dana Downs is a 41 y.o. female who is seen today as an office consultation at the request of Dr. Julien Girt for evaluation of Rectal Pain .  Patient states she has had trouble with anal fissures and constipation in the past.  She was pregnant throughout the fall and winter of last year and developed an anal fissure in January after an episode of constipation.  Since then she has had trouble with anal pain and burning with bowel movements.  She tried nitroglycerin ointment but did not have much success with this due to headaches.  She is now on diltiazem and ointment has been using this for the past 2 to 3 weeks.  She has not noticed any significant changes.  She is having better success with her bowel movements now that she is on metformin.  She is using dietary modifications as well.   Review of Systems: A complete review of systems was obtained from the patient.  I have reviewed this information and discussed as appropriate with the patient.  See HPI as well for other ROS.   Medical History: Past Medical History:  Diagnosis Date   Diabetes mellitus without complication (CMS-HCC)    Hypertension    Thyroid disease     There is no problem list on file for this patient.   Past Surgical History:  Procedure Laterality Date   CESAREAN SECTION     CHOLECYSTECTOMY       Allergies  Allergen Reactions   Penicillin Anaphylaxis and Hives   Acetaminophen Hives    Can take with benadryl   Sulfa (Sulfonamide Antibiotics) Hives    Current Outpatient Medications on File Prior to Visit  Medication Sig Dispense Refill   metFORMIN (GLUCOPHAGE) 500 MG tablet Take 1 tablet twice a day by oral route.     No current facility-administered medications on file prior to  visit.    Family History  Problem Relation Age of Onset   Obesity Mother    High blood pressure (Hypertension) Mother    Hyperlipidemia (Elevated cholesterol) Father    Diabetes Father    Obesity Sister      Social History   Tobacco Use  Smoking Status Former   Types: Cigarettes  Smokeless Tobacco Never     Social History   Socioeconomic History   Marital status: Married  Tobacco Use   Smoking status: Former    Types: Cigarettes   Smokeless tobacco: Never  Vaping Use   Vaping Use: Never used  Substance and Sexual Activity   Alcohol use: Not Currently   Drug use: Never    Objective:    Vitals:   03/25/22 1315  Pulse: 88  Temp: 36.9 C (98.5 F)  SpO2: 99%  Weight: (!) 111 kg (244 lb 12.8 oz)  Height: 175.3 cm ('5\' 9"'$ )     Exam Gen: NAD Abd: soft Rectal: Posterior anal fissure noted   Labs, Imaging and Diagnostic Testing:   Assessment and Plan:  Diagnoses and all orders for this visit:  Anal fissure    Patient with chronic constipation and a posterior anal fissure.  We discussed medical management with continued diltiazem versus Botox injection and chemical sphincterotomy versus complete surgical sphincterotomy as possible treatment options.  We discussed the risk and benefits  of all of these options as well.  Patient has elected to proceed with diltiazem and ointment for the next few weeks but we will schedule her for chemical sphincterotomy as well and we will plan on performing this in September if her symptoms have not improved.  We discussed the need to have soft bowel movements going forward to help the area heal no matter what treatment is provided.    No follow-ups on file.    Rosario Adie, MD Colon and Rectal Surgery [facility]

## 2022-06-25 ENCOUNTER — Other Ambulatory Visit: Payer: Self-pay

## 2022-06-25 ENCOUNTER — Encounter: Payer: Self-pay | Admitting: Family Medicine

## 2022-06-25 MED ORDER — LINACLOTIDE 145 MCG PO CAPS: 145.0000 ug | ORAL_CAPSULE | Freq: Every day | ORAL | 4 refills | Status: DC

## 2022-06-25 NOTE — Telephone Encounter (Signed)
Left message for patient to return call about request received for linzess '145mg'$  , not on current med list.

## 2022-06-26 LAB — HM PAP SMEAR: HM Pap smear: NORMAL

## 2022-07-22 ENCOUNTER — Encounter: Payer: Self-pay | Admitting: Family Medicine

## 2022-07-23 ENCOUNTER — Telehealth: Payer: Self-pay

## 2022-07-23 ENCOUNTER — Ambulatory Visit (INDEPENDENT_AMBULATORY_CARE_PROVIDER_SITE_OTHER): Payer: BC Managed Care – PPO | Admitting: Family Medicine

## 2022-07-23 DIAGNOSIS — R059 Cough, unspecified: Secondary | ICD-10-CM

## 2022-07-23 DIAGNOSIS — Z20828 Contact with and (suspected) exposure to other viral communicable diseases: Secondary | ICD-10-CM

## 2022-07-23 DIAGNOSIS — R509 Fever, unspecified: Secondary | ICD-10-CM | POA: Diagnosis not present

## 2022-07-23 DIAGNOSIS — J111 Influenza due to unidentified influenza virus with other respiratory manifestations: Secondary | ICD-10-CM

## 2022-07-23 MED ORDER — OSELTAMIVIR PHOSPHATE 75 MG PO CAPS: 75.0000 mg | ORAL_CAPSULE | Freq: Two times a day (BID) | ORAL | 0 refills | Status: DC

## 2022-07-23 NOTE — Progress Notes (Signed)
   Subjective:    Patient ID: Dana Downs, female    DOB: [birth year], 41 y.o.   MRN: [id number]  HPI I connected with  Dana Downs on 07/23/22 by a video enabled telemedicine application and verified that I am speaking with the correct person using two identifiers.   I discussed the limitations of evaluation and management by telemedicine. The patient expressed understanding and agreed to proceed.  Patient location: home  Provider location: in office  I provided 11 minutes of non face - to - face time during this encounter.  Flu like symptoms- fever 99 today with ibuprofen, body aches, chills, nasal congestion, cough started 2 days ago Daughter was diagnosed with the flu last week  Her daughter was diagnosed with the flu Then the patient started having some bodyaches discomfort not feeling good over the past couple days.  Some chills body aches nasal congestion cough.  No wheezing or difficulty breathing.  No vomiting.  Energy level subpar.  Feels somewhat better today.  Able to drink no vomiting Review of Systems     Objective:   Physical Exam  Today's visit was via telephone Physical exam was not possible for this visit       Assessment & Plan:  More than likely has a flu just based upon the child being diagnosed with the flu then mother getting sick within 2 days later best approach to this at this time would be Tamiflu twice daily warning signs was discussed if not seeing significant improvement in the next 48 to 72 hours recommend follow-up office visit Based upon no shortness of breath and able to keep liquids down no need to do lab work or x-rays currently.  Patient was offered to be seen in person should she have progressive symptoms

## 2022-07-23 NOTE — Telephone Encounter (Signed)
Dana Downs, Dana Downs are scheduled for a virtual visit with your provider today.    Just as we do with appointments in the office, we must obtain your consent to participate.  Your consent will be active for this visit and any virtual visit you may have with one of our providers in the next 365 days.    If you have a MyChart account, I can also send a copy of this consent to you electronically.  All virtual visits are billed to your insurance company just like a traditional visit in the office.  As this is a virtual visit, video technology does not allow for your provider to perform a traditional examination.  This may limit your provider's ability to fully assess your condition.  If your provider identifies any concerns that need to be evaluated in person or the need to arrange testing such as labs, EKG, etc, we will make arrangements to do so.    Although advances in technology are sophisticated, we cannot ensure that it will always work on either your end or our end.  If the connection with a video visit is poor, we may have to switch to a telephone visit.  With either a video or telephone visit, we are not always able to ensure that we have a secure connection.   I need to obtain your verbal consent now.   Are you willing to proceed with your visit today?   Dana Downs has provided verbal consent on 07/23/2022 for a virtual visit (video or telephone).   Leda Min, LPN [id number]  03:21 PM

## 2022-07-23 NOTE — Telephone Encounter (Signed)
Nurses I am more than willing to help Kyan out I need to do a phone visit or video visit in order to properly show that I am managing this issue.  Please connect with Claiborne Billings I can do either near noon or late afternoon

## 2022-07-26 ENCOUNTER — Encounter: Payer: Self-pay | Admitting: Family Medicine

## 2022-07-26 MED ORDER — LEVOFLOXACIN 500 MG PO TABS: ORAL_TABLET | ORAL | 0 refills | Status: DC

## 2022-07-26 NOTE — Telephone Encounter (Signed)
Nurses May do Levaquin 500 mg once daily for 7 days if any ongoing troubles with this will need to be seen thank you

## 2022-07-26 NOTE — Telephone Encounter (Signed)
Please see earlier my chart message

## 2022-09-04 ENCOUNTER — Encounter: Payer: Self-pay | Admitting: Gastroenterology

## 2022-10-03 ENCOUNTER — Ambulatory Visit: Payer: BC Managed Care – PPO | Admitting: Gastroenterology

## 2022-10-03 ENCOUNTER — Other Ambulatory Visit (INDEPENDENT_AMBULATORY_CARE_PROVIDER_SITE_OTHER): Payer: BC Managed Care – PPO

## 2022-10-03 ENCOUNTER — Encounter: Payer: Self-pay | Admitting: Gastroenterology

## 2022-10-03 VITALS — BP 110/70 | HR 80 | Ht 68.25 in | Wt 246.1 lb

## 2022-10-03 DIAGNOSIS — K602 Anal fissure, unspecified: Secondary | ICD-10-CM

## 2022-10-03 DIAGNOSIS — K5904 Chronic idiopathic constipation: Secondary | ICD-10-CM | POA: Diagnosis not present

## 2022-10-03 DIAGNOSIS — K581 Irritable bowel syndrome with constipation: Secondary | ICD-10-CM

## 2022-10-03 LAB — TSH: TSH: 0.88 u[IU]/mL (ref 0.35–5.50)

## 2022-10-03 MED ORDER — LINACLOTIDE 290 MCG PO CAPS: 290.0000 ug | ORAL_CAPSULE | Freq: Every day | ORAL | 3 refills | Status: DC

## 2022-10-03 MED ORDER — AMBULATORY NON FORMULARY MEDICATION: 3 refills | Status: DC

## 2022-10-03 NOTE — Patient Instructions (Signed)
_______________________________________________________  If your blood pressure at your visit was 140/90 or greater, please contact your primary care physician to follow up on this.  _______________________________________________________  If you are age 41 or older, your body mass index should be between 23-30. Your Body mass index is 37.15 kg/m. If this is out of the aforementioned range listed, please consider follow up with your Primary Care Provider.  If you are age 31 or younger, your body mass index should be between 19-25. Your Body mass index is 37.15 kg/m. If this is out of the aformentioned range listed, please consider follow up with your Primary Care Provider.   ________________________________________________________  The [facility] providers would like to encourage you to use [facility] to communicate with providers for non-urgent requests or questions.  Due to long hold times on the telephone, sending your provider a message by [facility] may be a faster and more efficient way to get a response.  Please allow 48 business hours for a response.  Please remember that this is for non-urgent requests.  _______________________________________________________  Your provider has requested that you go to the basement level for lab work before leaving today. Press "B" on the elevator. The lab is located at the first door on the left as you exit the elevator.  We have sent the following medications to your pharmacy for you to pick up at your convenience: Diltiazem , Linzess   Message [place] with an update

## 2022-10-03 NOTE — Progress Notes (Signed)
Agree with assessment and plan as outlined.  

## 2022-10-03 NOTE — Progress Notes (Signed)
10/03/2022 Dana Downs [id number] [birth year]   HISTORY OF PRESENT ILLNESS: This is a 42 year old female who is new to our office.  Referred here by Dr. Julien Girt her OB/GYN for evaluation of hemorrhoids.  She actually tells me that she has had lifelong constipation since she was a child.  She has been diagnosed with IBS-C.  She has seen Dr. Earlean Shawl remotely in the past.  While she was pregnant in 2022/2023 she had an awful time with constipation, rectal pain, spasm in her entire pelvic floor.  She had seen Dr. Benson Norway, but since she was pregnant he really could not do much for her and really could not even examine her because of all the tightness in her pelvic floor.  This is per her report as I do not have records.  She had done well with Linzess for a long time, but did not take it during her pregnancy.  As soon as she delivered her baby she restarted Linzess 145 mcg daily and did well with that.  She says that allows her to have 1 liquid/loose stool daily in the mornings and she is very satisfied with that.  Recently, however, she started [facility] and that has caused a little bit of constipation.  She does not want to get back into any severe issues that she has had in the past.  She also had an anal fissure on and off for several years.  She has seen Dr. Marcello Moores and they talked about Botox versus surgery, but she had done well with diltiazem previously.  She would like to have that prescription renewed and see how she does with that again.  She tells me that even eating one small piece of cheese will cause her to be constipated.  She does feel that she has a lot of gas and bloating.  She has never had a colonoscopy in the past.   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    Numbness    right thumb   Pins and needles sensation    right arm   Polycystic ovarian syndrome    Past Surgical History:  Procedure Laterality Date   CERVICAL DISC SURGERY     titanium disc, C5-C6   CESAREAN SECTION  09/02/2011   Procedure: CESAREAN SECTION;  Surgeon: Marylynn Pearson, MD;  Location: [facility];  Service: Gynecology;  Laterality: N/A;  Primary cesarean section   CESAREAN SECTION WITH BILATERAL TUBAL LIGATION Bilateral 11/12/2021   Procedure: REPEAT CESAREAN SECTION WITH BILATERAL TUBAL LIGATION EDC: 12-03-21 ALLERG: BLEACH, DOXYCYCLINE, PENICILLINS, SULFA;  Surgeon: Everlene Farrier, MD;  Location: [facility];  Service: Obstetrics;  Laterality: Bilateral;   CHOLECYSTECTOMY  08/06/2011   NECK SURGERY  age 80-gland removal   atypical microbacterial infection   TOOTH EXTRACTION      reports that she quit smoking about 13 years ago. Her smoking use included cigarettes. She has never used smokeless tobacco. She reports that she does not drink alcohol and does not use drugs. family history includes AAA (abdominal aortic aneurysm) in her father; Asthma in her daughter; Barrett's esophagus in her mother; Breast  cancer in her paternal grandmother; COPD in her paternal grandfather; Diabetes in her father and paternal grandmother; Heart attack in her paternal grandfather; Hiatal hernia in her mother; Hyperlipidemia in her father; Hypertension in her mother; Irritable bowel syndrome in her mother; Multiple sclerosis in her mother; Ulcerative colitis in her sister. Allergies  Allergen Reactions   Penicillins Anaphylaxis   Doxycycline Hives   Sodium Hypochlorite Cough    Chlorine, Bleach   Sulfa Antibiotics Hives   Tylenol [Acetaminophen] Hives    Can take with benadryl       Outpatient Encounter Medications as of 10/03/2022  Medication Sig   Cholecalciferol (VITAMIN D3 PO) Take 1 Capful by mouth  daily.   levonorgestrel (MIRENA, 52 MG,) 20 MCG/DAY IUD 1 each by Intrauterine route once.   linaclotide (LINZESS) 145 MCG CAPS capsule Take 1 capsule (145 mcg total) by mouth daily before breakfast.   MOUNJARO 2.5 MG/0.5ML Pen Inject 2.5 mg into the skin once a week.   [DISCONTINUED] diltiazem 2 % GEL use 4 to 5 times per day on the anal fissures until healed   [DISCONTINUED] docusate sodium (COLACE) 100 MG capsule Take 300 mg by mouth at bedtime.   [DISCONTINUED] ibuprofen (ADVIL) 600 MG tablet Take 1 tablet (600 mg total) by mouth every 6 (six) hours as needed for mild pain.   [DISCONTINUED] levofloxacin (LEVAQUIN) 500 MG tablet Take one tablet po once daily for 7 days   [DISCONTINUED] oseltamivir (TAMIFLU) 75 MG capsule Take 1 capsule (75 mg total) by mouth 2 (two) times daily.   [DISCONTINUED] traMADol (ULTRAM) 50 MG tablet Take 1 tablet (50 mg total) by mouth every 6 (six) hours as needed for moderate pain (if uncontrolled with oxycodone).   No facility-administered encounter medications on file as of 10/03/2022.     REVIEW OF SYSTEMS  : All other systems reviewed and negative except where noted in the History of Present Illness.   PHYSICAL EXAM: BP 110/70 (BP Location: Left Arm, Patient Position: Sitting, Cuff Size: Normal)   Pulse 80   Ht 5' 8.25" (1.734 m) Comment: height measured without shoes  Wt 246 lb 2 oz (111.6 kg)   LMP 08/21/2022   BMI 37.15 kg/m  General: Well developed white female in no acute distress Head: Normocephalic and atraumatic Eyes:  Sclerae anicteric, conjunctiva pink. Ears: Normal auditory acuity Lungs: Clear throughout to auscultation; no W/R/R. Heart: Regular rate and rhythm; no M/R/G. Abdomen: Soft, non-distended.  BS present.  Non-tender. Musculoskeletal: Symmetrical with no gross deformities  Skin: No lesions on visible extremities Extremities: No edema  Neurological: Alert oriented x 4, grossly non-focal Psychological:  Alert and cooperative.  Normal mood and affect  ASSESSMENT AND PLAN: *CIC/IBS-C: Has had lifelong constipation.  Does well with Linzess 145 mcg daily, but recently started on Mounjaro and that has caused some worsening constipation.  Will increase the dose to 290 mcg daily.  Samples were given.  Prescription sent to pharmacy.  Check a TSH and celiac labs. *Anal fissure: Has seen Dr. Marcello Moores about this and they discussed Botox versus surgery, etc.  Was doing well with diltiazem.  Will prescribe diltiazem ointment with to be used 2-3 times daily for the next 6 to 10 weeks.  **She will keep [place] up-to-date with her progress over the next couple weeks and we will determine follow-up.   CC:  Kathyrn Drown, MD

## 2022-10-04 LAB — IGA: Immunoglobulin A: 280 mg/dL (ref 47–310)

## 2022-10-04 LAB — TISSUE TRANSGLUTAMINASE, IGA: (tTG) Ab, IgA: <1 U/mL

## 2022-10-14 ENCOUNTER — Encounter: Payer: Self-pay | Admitting: Family Medicine

## 2022-11-19 ENCOUNTER — Telehealth: Payer: BC Managed Care – PPO | Admitting: Family Medicine

## 2022-11-20 ENCOUNTER — Telehealth (INDEPENDENT_AMBULATORY_CARE_PROVIDER_SITE_OTHER): Payer: BC Managed Care – PPO | Admitting: Family Medicine

## 2022-11-20 DIAGNOSIS — F411 Generalized anxiety disorder: Secondary | ICD-10-CM

## 2022-11-20 MED ORDER — BUSPIRONE HCL 10 MG PO TABS
10.0000 mg | ORAL_TABLET | Freq: Two times a day (BID) | ORAL | 4 refills | Status: DC
Start: 2022-11-20 — End: 2022-12-10

## 2022-11-20 NOTE — Progress Notes (Signed)
   Subjective:    Patient ID: Dana Downs, female    DOB: 1980/11/20, 42 y.o.   MRN: [id number]  HPI Virtual Visit via Video Note  I connected with Dana Downs on 11/20/22 at  4:10 PM EDT by a video enabled telemedicine application and verified that I am speaking with the correct person using two identifiers.  Location: Patient: home Provider: office   I discussed the limitations of evaluation and management by telemedicine and the availability of in person appointments. The patient expressed understanding and agreed to proceed.  History of Present Illness:    Observations/Objective:   Assessment and Plan:   Follow Up Instructions:    I discussed the assessment and treatment plan with the patient. The patient was provided an opportunity to ask questions and all were answered. The patient agreed with the plan and demonstrated an understanding of the instructions.   The patient was advised to call back or seek an in-person evaluation if the symptoms worsen or if the condition fails to improve as anticipated.  I provided 17 minutes of non-face-to-face time during this encounter.   Lilyan Punt, MD  Patient would like to discuss anxiety being overwhelmed and how to cope. 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 Denies physical symptoms Relates a lot of worry that is hard to control Gets irritated at times and on edge Not depressed not suicidal Review of Systems     Objective:   Physical Exam Patient had virtual visit-video Appears to be in no distress Atraumatic Neuro able to relate and oriented No apparent resp distress Color normal  Pt does not want nor do we rx xanax for this type of issue      Assessment & Plan:  GAD Discussed SRI and buspar Pt fearful of suicidal ideation as a rare side  effect Counseling -not interested due to time deficit Will send her some self help material Will start buspar 10 mg one bid Notifyus if side effects Call if issues Will do follow up phone cal MAY 6 or 7

## 2022-12-10 ENCOUNTER — Telehealth (INDEPENDENT_AMBULATORY_CARE_PROVIDER_SITE_OTHER): Payer: BC Managed Care – PPO | Admitting: Family Medicine

## 2022-12-10 DIAGNOSIS — F411 Generalized anxiety disorder: Secondary | ICD-10-CM

## 2022-12-10 MED ORDER — BUSPIRONE HCL 10 MG PO TABS: 10.0000 mg | ORAL_TABLET | Freq: Two times a day (BID) | ORAL | 6 refills | Status: DC

## 2022-12-10 NOTE — Addendum Note (Signed)
Addended by: Lilyan Punt A on: 12/10/2022 08:26 PM   Modules accepted: Level of Service

## 2022-12-10 NOTE — Progress Notes (Addendum)
   Subjective:    Patient ID: Dana Downs, female    DOB: July 19, 1981, 41 y.o.   MRN: [id number]  HPI Dana Downs,you are scheduled for a virtual visit with your provider today.    Just as we do with appointments in the office, we must obtain your consent to participate.  Your consent will be active for this visit and any virtual visit you may have with one of our providers in the next 365 days.    If you have a MyChart account, I can also send a copy of this consent to you electronically.  All virtual visits are billed to your insurance company just like a traditional visit in the office.  As this is a virtual visit, video technology does not allow for your provider to perform a traditional examination.  This may limit your provider's ability to fully assess your condition.  If your provider identifies any concerns that need to be evaluated in person or the need to arrange testing such as labs, EKG, etc, we will make arrangements to do so.    Although advances in technology are sophisticated, we cannot ensure that it will always work on either your end or our end.  If the connection with a video visit is poor, we may have to switch to a telephone visit.  With either a video or telephone visit, we are not always able to ensure that we have a secure connection.   I need to obtain your verbal consent now.   Are you willing to proceed with your visit today?   Dana Downs has provided verbal consent on 12/10/2022 for a virtual visit (video or telephone).  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

## 2022-12-16 ENCOUNTER — Encounter: Payer: Self-pay | Admitting: Family Medicine

## 2023-01-13 ENCOUNTER — Encounter: Payer: Self-pay | Admitting: Family Medicine

## 2023-01-15 NOTE — Telephone Encounter (Signed)
We do not have samples of this medicine I would recommend that she check with [facility] to see if they may have samples

## 2023-01-17 ENCOUNTER — Telehealth: Payer: Self-pay | Admitting: Gastroenterology

## 2023-01-17 NOTE — Telephone Encounter (Signed)
PT wants to know if she can get samples of Linzess 290. Her insurance doesn't cover it. She is in [place] now and wants to know if she can come get them ASAP because she lives an hour away. Please advise.

## 2023-01-17 NOTE — Telephone Encounter (Signed)
Spoke with patient samples placed up front.

## 2023-01-17 NOTE — Telephone Encounter (Signed)
Spoke with patient and Linzess samples up front for pick up.

## 2023-02-02 ENCOUNTER — Other Ambulatory Visit: Payer: Self-pay | Admitting: Gastroenterology

## 2023-02-10 NOTE — Telephone Encounter (Signed)
Please advise 

## 2023-02-11 NOTE — Telephone Encounter (Signed)
Linzess 290 mcg with [facility] is $538. Can we switch her to Amitiza?

## 2023-02-20 MED ORDER — LUBIPROSTONE 8 MCG PO CAPS: 8.0000 ug | ORAL_CAPSULE | Freq: Two times a day (BID) | ORAL | 3 refills | Status: DC

## 2023-02-20 NOTE — Addendum Note (Signed)
Addended by: Mariane Duval on: 02/20/2023 02:09 PM   Modules accepted: Orders

## 2023-08-17 ENCOUNTER — Other Ambulatory Visit: Payer: Self-pay | Admitting: Gastroenterology

## 2023-08-28 ENCOUNTER — Ambulatory Visit: Payer: Self-pay | Admitting: Family Medicine

## 2023-08-28 ENCOUNTER — Encounter: Payer: Self-pay | Admitting: Family Medicine

## 2023-08-28 ENCOUNTER — Telehealth: Payer: Self-pay | Admitting: Family Medicine

## 2023-08-28 ENCOUNTER — Ambulatory Visit (INDEPENDENT_AMBULATORY_CARE_PROVIDER_SITE_OTHER): Payer: Commercial Managed Care - PPO | Admitting: Family Medicine

## 2023-08-28 VITALS — BP 136/87 | HR 97 | Temp 98.8°F | Ht 68.25 in | Wt 242.0 lb

## 2023-08-28 DIAGNOSIS — R051 Acute cough: Secondary | ICD-10-CM

## 2023-08-28 DIAGNOSIS — R062 Wheezing: Secondary | ICD-10-CM | POA: Diagnosis not present

## 2023-08-28 DIAGNOSIS — J111 Influenza due to unidentified influenza virus with other respiratory manifestations: Secondary | ICD-10-CM

## 2023-08-28 LAB — VERITOR FLU A/B WAIVED
Influenza A: POSITIVE — AB
Influenza B: NEGATIVE

## 2023-08-28 LAB — RSV AG, IMMUNOCHR, WAIVED: RSV Ag, Immunochr, Waived: NEGATIVE

## 2023-08-28 MED ORDER — CHLORPHEN-PE-ACETAMINOPHEN 4-10-325 MG PO TABS
1.0000 | ORAL_TABLET | Freq: Four times a day (QID) | ORAL | 0 refills | Status: DC | PRN
Start: 2023-08-28 — End: 2023-10-30

## 2023-08-28 MED ORDER — OSELTAMIVIR PHOSPHATE 75 MG PO CAPS
75.0000 mg | ORAL_CAPSULE | Freq: Two times a day (BID) | ORAL | 0 refills | Status: AC
Start: 2023-08-28 — End: 2023-09-02

## 2023-08-28 MED ORDER — ALBUTEROL SULFATE HFA 108 (90 BASE) MCG/ACT IN AERS
2.0000 | INHALATION_SPRAY | Freq: Four times a day (QID) | RESPIRATORY_TRACT | 2 refills | Status: AC | PRN
Start: 2023-08-28 — End: ?

## 2023-08-28 NOTE — Progress Notes (Signed)
Acute Office Visit  Subjective:     Patient ID: Dana Downs, female    DOB: 09/13/1980, 43 y.o.   MRN: [id number]  Chief Complaint  Patient presents with   Cough    Cough This is a new problem. Episode onset: 2 days ago. The problem has been gradually worsening. The cough is Non-productive. Associated symptoms include ear congestion, nasal congestion, postnasal drip, shortness of breath (feels harder to take breaths) and wheezing (at night). Pertinent negatives include no chest pain, chills, ear pain, fever, headaches, myalgias or sore throat. Treatments tried: NAIDS, tylenol cold and flu. The treatment provided no relief. Her past medical history is significant for asthma (as a child), bronchitis and pneumonia. There is no history of COPD.   Husband and children have had HFMD, RSV, flu and pneumonia over the last month.   Review of Systems  Constitutional:  Negative for chills and fever.  HENT:  Positive for postnasal drip. Negative for ear pain and sore throat.   Respiratory:  Positive for cough, shortness of breath (feels harder to take breaths) and wheezing (at night).   Cardiovascular:  Negative for chest pain.  Musculoskeletal:  Negative for myalgias.  Neurological:  Negative for headaches.        Objective:    BP 136/87   Pulse 97   Temp 98.8 F (37.1 C) (Temporal)   Ht 5' 8.25" (1.734 m)   Wt 242 lb (109.8 kg)   SpO2 98%   BMI 36.53 kg/m    Physical Exam Vitals and nursing note reviewed.  Constitutional:      General: She is not in acute distress.    Appearance: She is ill-appearing. She is not toxic-appearing or diaphoretic.  HENT:     Head: Normocephalic and atraumatic.     Right Ear: Ear canal and external ear normal. A middle ear effusion is present. Tympanic membrane is not perforated, erythematous, retracted or bulging.     Left Ear: Ear canal and external ear normal. A middle ear effusion is present. Tympanic membrane is not perforated,  erythematous, retracted or bulging.     Nose: Congestion present.     Mouth/Throat:     Mouth: Mucous membranes are moist.     Pharynx: Oropharynx is clear. No oropharyngeal exudate or posterior oropharyngeal erythema.  Eyes:     General:        Right eye: No discharge.     Conjunctiva/sclera: Conjunctivae normal.  Cardiovascular:     Rate and Rhythm: Normal rate and regular rhythm.     Heart sounds: Normal heart sounds. No murmur heard. Pulmonary:     Effort: Pulmonary effort is normal. No respiratory distress.     Breath sounds: Normal breath sounds. No wheezing, rhonchi or rales.  Musculoskeletal:     Cervical back: Neck supple. No rigidity.     Right lower leg: No edema.     Left lower leg: No edema.  Lymphadenopathy:     Cervical: No cervical adenopathy.  Skin:    General: Skin is warm and dry.  Neurological:     General: No focal deficit present.     Mental Status: She is alert and oriented to person, place, and time.  Psychiatric:        Mood and Affect: Mood normal.        Behavior: Behavior normal.     No results found for any visits on 08/28/23.      Assessment & Plan:   Tresa Endo  was seen today for cough.  Diagnoses and all orders for this visit:  Acute cough -     RSV Ag, Immunochr, Waived -     Veritor Flu A/B Waived  Influenza -     oseltamivir (TAMIFLU) 75 MG capsule; Take 1 capsule (75 mg total) by mouth 2 (two) times daily for 5 days. -     Chlorphen-PE-Acetaminophen 4-10-325 MG TABS; Take 1 tablet by mouth every 6 (six) hours as needed.  Wheezing -     albuterol (VENTOLIN HFA) 108 (90 Base) MCG/ACT inhaler; Inhale 2 puffs into the lungs every 6 (six) hours as needed for wheezing or shortness of breath.   + Flu A today. Lungs clear on exam. Tamiflu discussed and ordered. Noral AD prn. Do not take with other decongestants or tylenol products. Albuterol prn for wheezing. Discussed symptomatic care and return precautions.   The patient indicates  understanding of these issues and agrees with the plan.  Gabriel Earing, FNP

## 2023-08-28 NOTE — Telephone Encounter (Signed)
Called patient to offer her appointment for today in our office and she stated she was sent to [place] to see one of their doctors because we were full

## 2023-08-28 NOTE — Telephone Encounter (Signed)
Fyi scheduled with you   Copied from CRM [id number]. Topic: Clinical - Red Word Triage >> Aug 28, 2023 10:47 AM Franchot Heidelberg wrote: Red Word that prompted transfer to Nurse Triage: Wheezing + SOB

## 2023-08-28 NOTE — Telephone Encounter (Signed)
Chief Complaint: Coughing, wheezing, congestion Symptoms: Coughing, wheezing, congestion Frequency: two days Pertinent Negatives: Patient denies fever Disposition: [] ED /[] Urgent Care (no appt availability in office) / [x] Appointment(In office/virtual)/ []  [facility]/ [] Home Care/ [] Refused Recommended Disposition /[] [facility]/ []  Follow-up with PCP Additional Notes: Patient called stating she is experiencing very frequent coughing, wheezing and congestion. Patient states she isn't experiencing normal flu-like symptoms, but her family has had the flu recently. Patient denies any report of fever or other URI symptoms.    Reason for Disposition  Continuous (nonstop) coughing interferes with work, school, or sleeping  Answer Assessment - Initial Assessment Questions 1. RESPIRATORY STATUS: "Describe your breathing?" (e.g., wheezing, shortness of breath, unable to speak, severe coughing)      When I take a deep breath, it makes me cough and I hear some wheezing.  2. ONSET: "When did this breathing problem begin?"      Last night into day - "been feeling crummy and congested for two days now" 4. SEVERITY: "How bad is your breathing?" (e.g., mild, moderate, severe)    - MILD: No SOB at rest, mild SOB with walking, speaks normally in sentences, can lie down, no retractions, pulse < 100.    - MODERATE: SOB at rest, SOB with minimal exertion and prefers to sit, cannot lie down flat, speaks in phrases, mild retractions, audible wheezing, pulse 100-120.    - SEVERE: Very SOB at rest, speaks in single words, struggling to breathe, sitting hunched forward, retractions, pulse > 120      Mild 6. CARDIAC HISTORY: "Do you have any history of heart disease?" (e.g., heart attack, angina, bypass surgery, angioplasty)      No 7. LUNG HISTORY: "Do you have any history of lung disease?"  (e.g., pulmonary embolus, asthma, emphysema)     No 8. CAUSE: "What do you think is causing the  breathing problem?"      Unsure, family has been sick recently with flu and pneumonia but "I do not have normal symptoms" 9. OTHER SYMPTOMS: "Do you have any other symptoms? (e.g., dizziness, runny nose, cough, chest pain, fever)     Cough, wheezing 11. PREGNANCY: "Is there any chance you are pregnant?" "When was your last menstrual period?"       No  Protocols used: Breathing Difficulty-A-AH

## 2023-08-29 ENCOUNTER — Encounter: Payer: Self-pay | Admitting: Family Medicine

## 2023-08-29 ENCOUNTER — Encounter: Payer: Self-pay | Admitting: Physician Assistant

## 2023-08-29 ENCOUNTER — Ambulatory Visit (INDEPENDENT_AMBULATORY_CARE_PROVIDER_SITE_OTHER): Payer: Commercial Managed Care - PPO | Admitting: Physician Assistant

## 2023-08-29 VITALS — BP 135/87 | HR 77 | Temp 97.3°F | Ht 68.25 in | Wt 247.0 lb

## 2023-08-29 DIAGNOSIS — H6690 Otitis media, unspecified, unspecified ear: Secondary | ICD-10-CM | POA: Diagnosis not present

## 2023-08-29 DIAGNOSIS — J111 Influenza due to unidentified influenza virus with other respiratory manifestations: Secondary | ICD-10-CM

## 2023-08-29 DIAGNOSIS — J019 Acute sinusitis, unspecified: Secondary | ICD-10-CM

## 2023-08-29 MED ORDER — CLINDAMYCIN HCL 300 MG PO CAPS: 300.0000 mg | ORAL_CAPSULE | Freq: Four times a day (QID) | ORAL | 0 refills | Status: AC

## 2023-08-29 MED ORDER — PROMETHAZINE-DM 6.25-15 MG/5ML PO SYRP: 5.0000 mL | ORAL_SOLUTION | Freq: Four times a day (QID) | ORAL | 0 refills | Status: DC | PRN

## 2023-08-29 NOTE — Progress Notes (Signed)
Acute Office Visit  Subjective:     Patient ID: Dana Downs, female    DOB: 04-15-1981, 43 y.o.   MRN: [id number]   HPI  Patient is in today for right ear pain.  Patient was seen yesterday and diagnosed with influenza, treated with Tamiflu.  At this visit NP noted fluid behind TM however patient was not experiencing ear pain at that time.  Started this morning patient began experiencing throbbing ear pain and pressure as well as sinus pain.  She states pain is radiating to her right eye and jaw.  Patient denies fevers at this time.  Patient with penicillin, sulfa, doxycycline allergies.  Patient eating and drinking as usual.  Review of Systems  Constitutional:  Negative for chills, diaphoresis and fever.  HENT:  Positive for congestion, ear pain and sinus pain. Negative for ear discharge and sore throat.   Respiratory:  Positive for cough, sputum production and wheezing.   Cardiovascular:  Negative for chest pain and palpitations.  Gastrointestinal:  Negative for abdominal pain, nausea and vomiting.  Neurological:  Negative for headaches.        Objective:     BP 135/87   Pulse 77   Temp (!) 97.3 F (36.3 C)   Ht 5' 8.25" (1.734 m)   Wt 247 lb (112 kg)   SpO2 99%   BMI 37.28 kg/m   Physical Exam Vitals reviewed.  Constitutional:      General: She is not in acute distress.    Appearance: Normal appearance.  HENT:     Right Ear: Tenderness present. Tympanic membrane is erythematous and bulging.     Left Ear: Tympanic membrane normal.     Nose: Congestion and rhinorrhea present.     Right Sinus: Frontal sinus tenderness present.     Left Sinus: Frontal sinus tenderness present.     Mouth/Throat:     Mouth: Mucous membranes are moist.     Pharynx: Oropharynx is clear. Posterior oropharyngeal erythema present.  Eyes:     Extraocular Movements: Extraocular movements intact.     Conjunctiva/sclera: Conjunctivae normal.  Cardiovascular:     Rate and Rhythm: Normal  rate and regular rhythm.     Heart sounds: No murmur heard.    No friction rub. No gallop.  Pulmonary:     Effort: Pulmonary effort is normal.     Breath sounds: Normal breath sounds. No stridor. No wheezing, rhonchi or rales.  Musculoskeletal:        General: Normal range of motion.  Skin:    General: Skin is warm and dry.     Capillary Refill: Capillary refill takes less than 2 seconds.  Neurological:     General: No focal deficit present.     Mental Status: She is alert and oriented to person, place, and time.  Psychiatric:        Mood and Affect: Mood normal.        Behavior: Behavior normal.     No results found for any visits on 08/29/23.      Assessment & Plan:  Acute otitis media, unspecified otitis media type -     Clindamycin HCl; Take 1 capsule (300 mg total) by mouth 4 (four) times daily for 7 days.  Dispense: 28 capsule; Refill: 0  Acute rhinosinusitis -     Clindamycin HCl; Take 1 capsule (300 mg total) by mouth 4 (four) times daily for 7 days.  Dispense: 28 capsule; Refill: 0  Influenza -  Promethazine-DM; Take 5 mLs by mouth 4 (four) times daily as needed for cough.  Dispense: 118 mL; Refill: 0   Presentation was consistent with sinusitis.  Discussed that this fits the picture of viral vs bacterial sinusitis and that due to type and duration of symptoms and exam findings, we will treat as bacterial sinusitis. Right TM erythematous and bulging, treating for otitis media. No signs of other bacterial infections such as pharyngitis or pneumonia. Clindamycin prescribed due penicillin, sulfa, and doxycycline allergies. Advised to continue Tamiflu. The patient was instructed to return if the worsens in any way, especially if not tolerating fluids, increased sinus pain or swelling, worsening headache, persistent fever, difficulty swallowing or breathing, diarrhea, or as needed. The patient agreed with the plan.    Return if symptoms worsen or fail to  improve.  Toni Amend Kirsten Spearing, PA-C

## 2023-10-30 ENCOUNTER — Encounter: Payer: Self-pay | Admitting: Physician Assistant

## 2023-10-30 ENCOUNTER — Other Ambulatory Visit: Payer: Self-pay | Admitting: Family Medicine

## 2023-10-30 ENCOUNTER — Ambulatory Visit (INDEPENDENT_AMBULATORY_CARE_PROVIDER_SITE_OTHER): Admitting: Physician Assistant

## 2023-10-30 VITALS — BP 112/74 | HR 68 | Temp 98.6°F | Ht 68.25 in | Wt 238.0 lb

## 2023-10-30 DIAGNOSIS — J020 Streptococcal pharyngitis: Secondary | ICD-10-CM | POA: Diagnosis not present

## 2023-10-30 LAB — POCT RAPID STREP A (OFFICE): Rapid Strep A Screen: POSITIVE — AB

## 2023-10-30 MED ORDER — AZITHROMYCIN 250 MG PO TABS: ORAL_TABLET | ORAL | 0 refills | Status: AC

## 2023-10-30 MED ORDER — CEFDINIR 300 MG PO CAPS: 300.0000 mg | ORAL_CAPSULE | Freq: Two times a day (BID) | ORAL | 0 refills | Status: AC

## 2023-10-30 NOTE — Progress Notes (Signed)
 Acute Office Visit  Subjective:     Patient ID: Dana Downs, female    DOB: September 05, 1980, 43 y.o.   MRN: [id number]   Patient presents today with complaints of right ear pain and sore throat.  She reports symptoms began around March 6 and was seen and treated in urgent care for otitis media.  Patient reports mild improvement in symptoms however over the last week has experienced returning ear pain.  She reports dry and sore throat in recent days as well.  Associated symptoms include nonproductive dry persistent cough.  She denies fevers, shortness of breath, or chest pain.     Review of Systems  Constitutional:  Negative for chills, fever and malaise/fatigue.  HENT:  Positive for ear pain and sore throat. Negative for congestion and ear discharge.   Respiratory:  Positive for cough and wheezing. Negative for shortness of breath.   Cardiovascular:  Negative for chest pain.  Musculoskeletal:  Negative for myalgias.  Neurological:  Negative for headaches.        Objective:     BP 112/74   Pulse 68   Temp 98.6 F (37 C)   Ht 5' 8.25" (1.734 m)   Wt 238 lb (108 kg)   SpO2 98%   BMI 35.92 kg/m   Physical Exam Vitals reviewed.  Constitutional:      General: She is not in acute distress.    Appearance: Normal appearance.  HENT:     Right Ear: Tympanic membrane normal. There is no impacted cerumen.     Left Ear: Tympanic membrane normal. There is no impacted cerumen.     Nose: Nose normal.     Mouth/Throat:     Mouth: Mucous membranes are moist.     Pharynx: Oropharynx is clear. Posterior oropharyngeal erythema present.  Eyes:     Extraocular Movements: Extraocular movements intact.     Conjunctiva/sclera: Conjunctivae normal.  Cardiovascular:     Rate and Rhythm: Normal rate and regular rhythm.     Heart sounds: Normal heart sounds. No murmur heard. Pulmonary:     Effort: Pulmonary effort is normal.     Breath sounds: Normal breath sounds. No wheezing.   Lymphadenopathy:     Cervical: Cervical adenopathy present.     Right cervical: Superficial cervical adenopathy present.  Skin:    General: Skin is warm and dry.  Neurological:     General: No focal deficit present.     Mental Status: She is alert and oriented to person, place, and time.  Psychiatric:        Mood and Affect: Mood normal.        Behavior: Behavior normal.     Results for orders placed or performed in visit on 10/30/23  POCT rapid strep A  Result Value Ref Range   Rapid Strep A Screen Positive (A) Negative        Assessment & Plan:  Strep pharyngitis -     POCT rapid strep A -     Cefdinir; Take 1 capsule (300 mg total) by mouth 2 (two) times daily for 10 days.  Dispense: 20 capsule; Refill: 0  Patient appears stable today. Overall benign exam, lungs clear to auscultation bilaterally, normal heart sounds. Ears unremarkable on exam, TMs non erythematous and non bulging. Minor right sided cervical lymphadenopathy. Rapid strep test positive in office. Starting on Omnicef due to penicillins allergy, she has previously tolerated cephalosporins. Encouraged use of previously prescribed promethazine DM and albuterol inhaler for  cough. Tylenol or ibuprofen for pain as needed. May continue with OTC cold medications. I recommended Flonase for ear pressure and pain if persistent after strep treatment. Patient instructed to return to clinic if worsening shortness of breath, chest pain, hypoxia, or other concerns. Patient agreeable to plan.    Return if symptoms worsen or fail to improve.  Toni Amend Adeline Petitfrere, PA-C

## 2023-12-08 ENCOUNTER — Ambulatory Visit (HOSPITAL_COMMUNITY): Attending: Orthopedic Surgery | Admitting: Occupational Therapy

## 2023-12-08 DIAGNOSIS — R29898 Other symptoms and signs involving the musculoskeletal system: Secondary | ICD-10-CM | POA: Insufficient documentation

## 2023-12-08 DIAGNOSIS — M79631 Pain in right forearm: Secondary | ICD-10-CM | POA: Insufficient documentation

## 2023-12-11 ENCOUNTER — Other Ambulatory Visit: Payer: Self-pay | Admitting: Gastroenterology

## 2023-12-12 ENCOUNTER — Other Ambulatory Visit: Payer: Self-pay | Admitting: Family Medicine

## 2023-12-22 ENCOUNTER — Other Ambulatory Visit: Payer: Self-pay

## 2023-12-22 ENCOUNTER — Ambulatory Visit (HOSPITAL_COMMUNITY): Admitting: Occupational Therapy

## 2023-12-22 ENCOUNTER — Encounter (HOSPITAL_COMMUNITY): Payer: Self-pay | Admitting: Occupational Therapy

## 2023-12-22 DIAGNOSIS — M79631 Pain in right forearm: Secondary | ICD-10-CM

## 2023-12-22 DIAGNOSIS — R29898 Other symptoms and signs involving the musculoskeletal system: Secondary | ICD-10-CM

## 2023-12-22 NOTE — Therapy (Signed)
 OUTPATIENT OCCUPATIONAL THERAPY ORTHO EVALUATION  Patient Name: Dana Downs MRN: [id number] DOB:[birth year], 43 y.o., female Today's Date: 12/22/2023    END OF SESSION:  OT End of Session - 12/22/23 1338     Visit Number 1    Number of Visits 3    Date for OT Re-Evaluation 01/21/24    Authorization Type [employer]    Authorization Time Period 30 visit limit    Authorization - Visit Number 1    Authorization - Number of Visits 30    OT Start Time 1303    OT Stop Time 1335    OT Time Calculation (min) 32 min    Activity Tolerance Patient tolerated treatment well    Behavior During Therapy WFL for tasks assessed/performed             Past Medical History:  Diagnosis Date   Abnormal Pap smear    Anal fissure    Complication of anesthesia    took 4 hours for legs to wake up with last CS   Constipation, chronic    Dysplasia    GERD (gastroesophageal reflux disease)    heartburn with pregnancy-occasional   Gestational diabetes    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    Numbness    right thumb   Pins and needles sensation    right arm   Polycystic ovarian syndrome    Past Surgical History:  Procedure Laterality Date   CERVICAL DISC SURGERY     titanium disc, C5-C6   CESAREAN SECTION  09/02/2011   Procedure: CESAREAN SECTION;  Surgeon: Ashby Lawman, MD;  Location: [facility];  Service: Gynecology;  Laterality: N/A;  Primary cesarean section   CESAREAN SECTION WITH BILATERAL TUBAL LIGATION Bilateral 11/12/2021   Procedure: REPEAT CESAREAN SECTION WITH BILATERAL TUBAL LIGATION EDC: 12-03-21 ALLERG: BLEACH, DOXYCYCLINE, PENICILLINS, SULFA;  Surgeon: Tomblin, James, MD;  Location: [facility];  Service: Obstetrics;  Laterality: Bilateral;   CHOLECYSTECTOMY  08/06/2011   NECK SURGERY  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 Charlotta Cook  REFERRING PROVIDER: Dr. Marilyn Shropshire  ONSET DATE: 10/14/23  REFERRING DIAG: M65.4 (ICD-10-CM) - Radial styloid tenosynovitis (de quervain)   THERAPY DIAG:  Pain in right forearm  Other symptoms and signs involving the musculoskeletal system  Rationale for Evaluation and Treatment: Rehabilitation  SUBJECTIVE:   SUBJECTIVE STATEMENT: S: I have some stiffness in the wrist and thumb. Pt accompanied by: self  PERTINENT HISTORY: Pt is a 43 y/o female s/p right first compartment release for De Quervian's tenosynovitis on 10/14/23. Pt reports improvement in her symptoms with exception of some lingering stiffness, soreness, and weakness.   PRECAUTIONS: None  WEIGHT BEARING RESTRICTIONS: No  PAIN:  Are you having pain? No  FALLS: Has patient fallen in last 6 months? No  PLOF: Independent  PATIENT GOALS: To have more mobility and improved strength   OBJECTIVE:  Note: Objective measures were completed at Evaluation unless otherwise noted.  HAND DOMINANCE: Right  ADLs: Pt reports difficulty with peri-hygiene, caregiver tasks-wiping son, lifting and transferring son  UPPER EXTREMITY ROM:     Active ROM Right eval  Wrist flexion 45  Wrist extension 60  Wrist ulnar deviation 28  Wrist radial deviation 25  Wrist pronation 90  Wrist supination 90  (Blank rows = not tested)   UPPER EXTREMITY MMT:     MMT Right eval  Wrist flexion 4+/5  Wrist extension 4+/5  Wrist ulnar deviation 4+/5  Wrist radial deviation 4+/5  (Blank rows = not tested)  HAND FUNCTION: Grip strength: Right: 58 lbs;  Left: 60 lbs, Lateral pinch: Right: 5 lbs, Left: 11 lbs, and 3 point pinch: Right: 7 lbs, Left: 11 lbs  SENSATION: Intermittent tingling, worse if arm is hit on something  EDEMA: None  COGNITION: Overall cognitive status: Within functional limits for tasks assessed  OBSERVATIONS: Trace fascial restrictions along radial side of right wrist and thumb   TREATMENT DATE:  -Provided XS compression glove -Educated on HEP and home stretches                                                                                                                                 PATIENT EDUCATION: Education details: compression glove wear (prn), wrist flexion stretch, theraputty for grip and pinch Person educated: Patient Education method: Programmer, multimedia, Demonstration, and Handouts Education comprehension: verbalized understanding and returned demonstration  HOME EXERCISE PROGRAM: 5/19: compression glove wear (prn), wrist flexion stretch, theraputty for grip and pinch  GOALS: Goals reviewed with patient? Yes  SHORT TERM GOALS: Target date: 01/22/24  Pt will be provided with and educated on HEP to improve functional use of RUE required for ADLs.   Goal status: INITIAL  2.  Pt will increase right wrist flexion by 15+ degrees to improve ability to reach behind her for bathing and dressing tasks.   Goal status: INITIAL  3.  Pt will increase right grip strength by 15# and pinch strength by 3# or greater to improve ability to grasp and maintain hold on items during household or yard work tasks.   Goal status: INITIAL  4.  Pt will increase right wrist strength to 5/5 to improve ability to lift and carry objects and perform heavy work tasks and caregiving tasks during ADLs.   Goal status: INITIAL  5.  Pt will report decreased pain in RUE of 3/10 or less to improve ability to use RUE as dominant during ADLs and when caring for her son.   Goal status: INITIAL     ASSESSMENT:  CLINICAL  IMPRESSION: Patient is a 43 y.o. female who was seen today for occupational therapy evaluation s/p right 1st compartment release for De Quervain's tenosynovitis in March. Pt with stiffness, weakness, and decreased grip strength in the right hand and wrist, along with mild pain impacting pt's ability to use the RUE as dominant during daily tasks. Provided XS edema glove  today along with HEP.   PERFORMANCE DEFICITS: in functional skills including ADLs, IADLs, coordination, dexterity, sensation, ROM, strength, pain, and UE functional use  IMPAIRMENTS: are limiting patient from ADLs, IADLs, work, and leisure.   COMORBIDITIES: has no other co-morbidities that affects occupational performance. Patient will benefit from skilled OT to address above impairments and improve overall function.  MODIFICATION OR ASSISTANCE TO COMPLETE EVALUATION: No modification of tasks or assist necessary to complete an evaluation.  OT OCCUPATIONAL PROFILE AND HISTORY: Problem focused assessment: Including review of records relating to presenting problem.  CLINICAL DECISION MAKING: LOW - limited treatment options, no task modification necessary  REHAB POTENTIAL: Good  EVALUATION COMPLEXITY: Low      PLAN:  OT FREQUENCY: every other week  OT DURATION: 4 weeks  PLANNED INTERVENTIONS: 97168 OT Re-evaluation, 97535 self care/ADL training, 10960 therapeutic exercise, 97530 therapeutic activity, 97112 neuromuscular re-education, 97140 manual therapy, 97035 ultrasound, 97018 paraffin, patient/family education, and DME and/or AE instructions  RECOMMENDED OTHER SERVICES: None at this time  CONSULTED AND AGREED WITH PLAN OF CARE: Patient  PLAN FOR NEXT SESSION: Follow up on compression glove wear and HEP, initiate wrist strengthening, grip and pinch strengthening   Lafonda Piety, OTR/L  [phone] 12/22/2023, 1:39 PM

## 2023-12-22 NOTE — Patient Instructions (Signed)
   1) Wrist Extensor Stretch: While standing or sitting upright, hold your injured arm straight out in front of you and point your fingers down toward the ground. With the hand of the uninjured arm, grasp the hand of the injured arm, thumb pressing on the palm, and try to bend the wrist further towards your body.        Home Exercises Program Theraputty Exercises  Do the following exercises 2 times a day using your affected hand.   1. Roll putty into a ball.  2. Make into a pancake.  3. Roll putty into a roll.  4. Pinch along log with first finger and thumb.   5. Make into a ball.  6. Roll it back into a log.   7. Pinch using thumb and side of first finger.  8. Roll into a ball, then flatten into a pancake.  9. Using your fingers, make putty into a mountain.  10. Roll putty back into a ball and squeeze gently for 2-3 minutes.

## 2024-01-09 ENCOUNTER — Encounter (HOSPITAL_COMMUNITY): Admitting: Occupational Therapy

## 2024-03-09 ENCOUNTER — Other Ambulatory Visit: Payer: Self-pay | Admitting: Family Medicine

## 2024-03-09 DIAGNOSIS — R062 Wheezing: Secondary | ICD-10-CM

## 2024-03-11 ENCOUNTER — Encounter: Payer: Self-pay | Admitting: Physician Assistant

## 2024-03-11 ENCOUNTER — Ambulatory Visit (INDEPENDENT_AMBULATORY_CARE_PROVIDER_SITE_OTHER): Admitting: Family Medicine

## 2024-03-11 ENCOUNTER — Encounter: Payer: Self-pay | Admitting: Family Medicine

## 2024-03-11 VITALS — BP 130/82 | HR 81 | Temp 98.3°F | Ht 68.25 in | Wt 230.0 lb

## 2024-03-11 DIAGNOSIS — J029 Acute pharyngitis, unspecified: Secondary | ICD-10-CM

## 2024-03-11 LAB — POCT RAPID STREP A (OFFICE): Rapid Strep A Screen: POSITIVE — AB

## 2024-03-11 MED ORDER — AZITHROMYCIN 250 MG PO TABS: ORAL_TABLET | ORAL | 0 refills | Status: AC

## 2024-03-11 NOTE — Assessment & Plan Note (Signed)
 Rapid strep positive.  Treating with azithromycin  given severe allergy to penicillin.

## 2024-03-11 NOTE — Progress Notes (Signed)
 Subjective:  Patient ID: Dana Downs, female    DOB: 1981-04-19  Age: 43 y.o. MRN: [id number]  CC:   Chief Complaint  Patient presents with   Sore Throat    Sore throat 1.5 weeks     HPI:  43 year old female presents with sore throat  Patient reports that she has had sore throat for the past 1.5 weeks.  Symptoms are persistent.  She reports some associated ear discomfort.  No fever.  No other respiratory symptoms.  No other complaints.  Of note, had strep pharyngitis earlier this year.  Patient Active Problem List   Diagnosis Date Noted   Sore throat 03/11/2024   Anal fissure 10/03/2022   Irritable bowel syndrome with constipation 10/03/2022   Chronic migraine w/o aura w/o status migrainosus, not intractable 06/08/2020   Thyroid  nodule 05/30/2016   Essential hypertension, benign 11/24/2012    Social Hx   Social History   Socioeconomic History   Marital status: Married    Spouse name: Not on file   Number of children: 2   Years of education: college   Highest education level: Not on file  Occupational History   Occupation: Therapist, art   Occupation: Chartered loss adjuster  Tobacco Use   Smoking status: Former    Current packs/day: 0.00    Types: Cigarettes    Quit date: 09/18/2009    Years since quitting: 14.4   Smokeless tobacco: Never  Vaping Use   Vaping status: Never Used  Substance and Sexual Activity   Alcohol use: No   Drug use: No   Sexual activity: Yes  Other Topics Concern   Not on file  Social History Narrative   Lives at home with family.   One biological daughter and one stepdaughter.   Right-handed.   Caffeine use: 16oz bottle of Coke per day.   Social Drivers of Corporate investment banker Strain: Not on file  Food Insecurity: Not on file  Transportation Needs: Not on file  Physical Activity: Not on file  Stress: Not on file  Social Connections: Not on file    Review of Systems Per HPI  Objective:  BP 130/82   Pulse 81    Temp 98.3 F (36.8 C)   Ht 5' 8.25 (1.734 m)   Wt 230 lb (104.3 kg)   SpO2 98%   BMI 34.72 kg/m      03/11/2024    2:43 PM 10/30/2023   11:28 AM 08/29/2023    3:48 PM  BP/Weight  Systolic BP 130 112 135  Diastolic BP 82 74 87  Wt. (Lbs) 230 238 247  BMI 34.72 kg/m2 35.92 kg/m2 37.28 kg/m2    Physical Exam Vitals and nursing note reviewed.  Constitutional:      General: She is not in acute distress.    Appearance: Normal appearance.  HENT:     Head: Normocephalic and atraumatic.     Right Ear: Tympanic membrane normal.     Left Ear: Tympanic membrane normal.     Mouth/Throat:     Pharynx: Posterior oropharyngeal erythema present.  Cardiovascular:     Rate and Rhythm: Normal rate and regular rhythm.  Pulmonary:     Effort: Pulmonary effort is normal.     Breath sounds: Normal breath sounds. No wheezing, rhonchi or rales.  Neurological:     Mental Status: She is alert.     Lab Results  Component Value Date   WBC 11.0 (H) 11/13/2021   HGB 10.3 (L) 11/13/2021  HCT 29.4 (L) 11/13/2021   PLT 189 11/13/2021   GLUCOSE 128 (H) 11/12/2021   CHOL 165 05/30/2016   TRIG 106 05/30/2016   HDL 36 (L) 05/30/2016   LDLCALC 108 (H) 05/30/2016   ALT 17 11/12/2021   AST 24 11/12/2021   NA 132 (L) 11/12/2021   K 3.9 11/12/2021   CL 103 11/12/2021   CREATININE 0.91 11/12/2021   BUN 10 11/12/2021   CO2 19 (L) 11/12/2021   TSH 0.88 10/03/2022   HGBA1C 4.5 05/16/2014     Assessment & Plan:  Sore throat Assessment & Plan: Rapid strep positive.  Treating with azithromycin  given severe allergy to penicillin.  Orders: -     POCT rapid strep A -     Azithromycin ; Take 2 tablets on day 1, then 1 tablet daily on days 2 through 5  Dispense: 6 tablet; Refill: 0    Follow-up:  Return if symptoms worsen or fail to improve.  Jacqulyn Ahle DO [facility]

## 2024-03-22 ENCOUNTER — Other Ambulatory Visit: Payer: Self-pay | Admitting: Family Medicine

## 2024-03-22 DIAGNOSIS — R062 Wheezing: Secondary | ICD-10-CM

## 2024-03-26 ENCOUNTER — Encounter: Payer: Self-pay | Admitting: Family Medicine

## 2024-03-26 ENCOUNTER — Other Ambulatory Visit: Payer: Self-pay

## 2024-03-26 DIAGNOSIS — J029 Acute pharyngitis, unspecified: Secondary | ICD-10-CM

## 2024-03-26 MED ORDER — AZITHROMYCIN 250 MG PO TABS: ORAL_TABLET | ORAL | 0 refills | Status: AC

## 2024-03-26 NOTE — Telephone Encounter (Signed)
 Nurses At times there can be treatment failure and require a second round Azithromycin  250 mg 2 tablets today then 1 tablet daily for 4 days   Also it should be noted that COVID can start off with a severe sore throat so if symptoms progress recommend home COVID test

## 2024-04-11 ENCOUNTER — Other Ambulatory Visit: Payer: Self-pay | Admitting: Gastroenterology

## 2024-04-15 ENCOUNTER — Other Ambulatory Visit: Payer: Self-pay | Admitting: Student

## 2024-04-15 DIAGNOSIS — M47816 Spondylosis without myelopathy or radiculopathy, lumbar region: Secondary | ICD-10-CM

## 2024-04-15 DIAGNOSIS — M5412 Radiculopathy, cervical region: Secondary | ICD-10-CM

## 2024-04-21 ENCOUNTER — Other Ambulatory Visit: Payer: Self-pay | Admitting: Family Medicine

## 2024-04-21 ENCOUNTER — Other Ambulatory Visit: Payer: Self-pay | Admitting: Gastroenterology

## 2024-04-21 DIAGNOSIS — R062 Wheezing: Secondary | ICD-10-CM

## 2024-04-22 ENCOUNTER — Other Ambulatory Visit: Payer: Self-pay | Admitting: Family Medicine

## 2024-04-22 DIAGNOSIS — R062 Wheezing: Secondary | ICD-10-CM

## 2024-04-30 ENCOUNTER — Ambulatory Visit
Admission: RE | Admit: 2024-04-30 | Discharge: 2024-04-30 | Disposition: A | Discharge status: Home / Self Care | Source: Ambulatory Visit | Attending: Student | Admitting: Student

## 2024-04-30 DIAGNOSIS — M47816 Spondylosis without myelopathy or radiculopathy, lumbar region: Secondary | ICD-10-CM

## 2024-04-30 DIAGNOSIS — M5412 Radiculopathy, cervical region: Secondary | ICD-10-CM

## 2024-05-13 ENCOUNTER — Encounter: Payer: Self-pay | Admitting: Family Medicine

## 2024-05-14 ENCOUNTER — Other Ambulatory Visit: Payer: Self-pay | Admitting: Nurse Practitioner

## 2024-05-14 MED ORDER — LUBIPROSTONE 8 MCG PO CAPS: ORAL_CAPSULE | ORAL | 2 refills | Status: DC

## 2024-06-09 ENCOUNTER — Encounter: Payer: Self-pay | Admitting: Family Medicine

## 2024-06-09 NOTE — Telephone Encounter (Signed)
 Nurses Sympathetic to what is going on with Dana Downs It is hard to know if it is ADD or other issues that could be triggering difficulty focusing  I recommend a follow-up office visit you may use one of my same-day slots although my schedule is quite booked up in the near future trying to get her in somewhere into November or early December at the latest thank you

## 2024-06-10 NOTE — Telephone Encounter (Signed)
 Appointment scheduled 06/11/24 at 9am with Elveria NP

## 2024-06-11 ENCOUNTER — Encounter: Payer: Self-pay | Admitting: Nurse Practitioner

## 2024-06-11 ENCOUNTER — Ambulatory Visit (INDEPENDENT_AMBULATORY_CARE_PROVIDER_SITE_OTHER): Admitting: Nurse Practitioner

## 2024-06-11 VITALS — BP 135/90 | HR 68 | Temp 97.7°F | Ht 68.25 in | Wt 235.0 lb

## 2024-06-11 DIAGNOSIS — F411 Generalized anxiety disorder: Secondary | ICD-10-CM | POA: Diagnosis not present

## 2024-06-11 DIAGNOSIS — R5383 Other fatigue: Secondary | ICD-10-CM | POA: Diagnosis not present

## 2024-06-11 DIAGNOSIS — Z13220 Encounter for screening for lipoid disorders: Secondary | ICD-10-CM

## 2024-06-11 DIAGNOSIS — E041 Nontoxic single thyroid nodule: Secondary | ICD-10-CM

## 2024-06-11 DIAGNOSIS — F988 Other specified behavioral and emotional disorders with onset usually occurring in childhood and adolescence: Secondary | ICD-10-CM | POA: Diagnosis not present

## 2024-06-11 MED ORDER — LISDEXAMFETAMINE DIMESYLATE 10 MG PO CAPS: 10.0000 mg | ORAL_CAPSULE | Freq: Every day | ORAL | 0 refills | Status: DC

## 2024-06-11 MED ORDER — AMPHETAMINE-DEXTROAMPHET ER 5 MG PO CP24: 5.0000 mg | ORAL_CAPSULE | Freq: Every day | ORAL | 0 refills | Status: DC

## 2024-06-11 NOTE — Progress Notes (Signed)
 Subjective:    Patient ID: Dana Downs, female    DOB: [birth year], 43 y.o.   MRN: [id number]  CC Trouble focusing  HPI 43 year old, woman, arrived with concerns that she was having trouble concentrating at work and at home. She feels scattered. She will begin task and stop midway to complete something else. For example, she was working on a report at work the other day and she stopped midway to make location changes with her computer. Shy typically tries to do intermittent fasting in the morning but eats a healthy lunch and dinner with a soda daily and does not eat fast food often. She reports physical activity through her job and with her toddler. The amount of sleep that she gets at night varies due to co sleeping with her toddler. She mentioned that she discontinued the buspar  for anxiety due to it making her drowsy for the first hour and could not take this and work.  Has been under stress due to family issues which has improved.   Review of Systems  Constitutional:  Positive for fatigue. Negative for activity change, appetite change and fever.  HENT:  Negative for sore throat and trouble swallowing.   Respiratory:  Negative for cough, chest tightness and shortness of breath.   Cardiovascular:  Negative for chest pain and leg swelling.  Gastrointestinal:  Negative for abdominal pain, constipation, diarrhea, nausea and vomiting.  Psychiatric/Behavioral:  Positive for decreased concentration. Negative for suicidal ideas.           06/11/2024    9:01 AM 03/11/2024    2:51 PM 10/30/2023   11:36 AM 08/29/2023    4:04 PM  GAD 7 : Generalized Anxiety Score  Nervous, Anxious, on Edge 0 0 0 0  Control/stop worrying 0 0 0 0  Worry too much - different things 0 0 0 0  Trouble relaxing 0 0 0 0  Restless 0 0 0 0  Easily annoyed or irritable 1 0 0 0  Afraid - awful might happen 1 0 0 0  Total GAD 7 Score 2 0 0 0  Anxiety Difficulty Not difficult at all Not difficult at all Not difficult at  all Not difficult at all      06/11/2024    9:01 AM  Depression screen PHQ 2/9  Decreased Interest 0  Down, Depressed, Hopeless 0  PHQ - 2 Score 0  Altered sleeping 0  Tired, decreased energy 0  Change in appetite 0  Feeling bad or failure about yourself  0  Trouble concentrating 3  Moving slowly or fidgety/restless 0  Suicidal thoughts 0  PHQ-9 Score 3  Difficult doing work/chores Somewhat difficult   Social History   Tobacco Use   Smoking status: Former    Current packs/day: 0.00    Types: Cigarettes    Quit date: 09/18/2009    Years since quitting: 14.7   Smokeless tobacco: Never  Vaping Use   Vaping status: Never Used  Substance Use Topics   Alcohol use: No   Drug use: No      Objective:   Physical Exam Vitals and nursing note reviewed.  Constitutional:      General: She is not in acute distress.    Appearance: Normal appearance.  Cardiovascular:     Rate and Rhythm: Normal rate and regular rhythm.     Heart sounds: Normal heart sounds.  Pulmonary:     Effort: Pulmonary effort is normal.     Breath sounds:  Normal breath sounds.  Musculoskeletal:     Right lower leg: No edema.     Left lower leg: No edema.  Neurological:     Mental Status: She is alert and oriented to person, place, and time.  Psychiatric:        Attention and Perception: Attention and perception normal.        Mood and Affect: Mood and affect normal.        Speech: Speech normal.        Behavior: Behavior normal.        Thought Content: Thought content normal.        Judgment: Judgment normal.    Vitals:   06/11/24 0853  BP: (!) 135/90  Pulse: 68  Temp: 97.7 F (36.5 C)  Height: 5' 8.25 (1.734 m)  Weight: 106.6 kg  SpO2: 98%  BMI (Calculated): 35.45     Adult ADHD Self Report Scale (most recent)     Adult ADHD Self-Report Scale (ASRS-v1.1) Symptom Checklist - 06/11/24 0900       Part A   1. How often do you have trouble wrapping up the final details of a project,  once the challenging parts have been done? Sometimes  2. How often do you have difficulty getting things done in order when you have to do a task that requires organization? Rarely    3. How often do you have problems remembering appointments or obligations? Often  4. When you have a task that requires a lot of thought, how often do you avoid or delay getting started? Sometimes    5. How often do you fidget or squirm with your hands or feet when you have to sit down for a long time? Very Often  6. How often do you feel overly active and compelled to do things, like you were driven by a motor? Rarely      Part B   7. How often do you make careless mistakes when you have to work on a boring or difficult project? Rarely  8. How often do you have difficulty keeping your attention when you are doing boring or repetitive work? Very Often    9. How often do you have difficulty concentrating on what people say to you, even when they are speaking to you directly? Sometimes  10. How often do you misplace or have difficulty finding things at home or at work? Sometimes    11. How often are you distracted by activity or noise around you? Often  12. How often do you leave your seat in meetings or other situations in which you are expected to remain seated? Sometimes    13. How often do you feel restless or fidgety? Sometimes  14. How often do you have difficulty unwinding and relaxing when you have time to yourself? Often    15. How often do you find yourself talking too much when you are in social situations? Often  16. When you are in a conversation, how often do you find yourself finishing the sentences of the people you are talking to, before they can finish them themselves? Very Often    17. How often do you have difficulty waiting your turn in situations when turn taking is required? Sometimes  18. How often do you interrupt others when they are busy? Often              Assessment & Plan:   1. Attention  deficit disorder, unspecified type (Primary) Discussed potential  side effects such as palpitations, tachycardia or dizziness. DC medication and contact the office if these occur.  Patient is aware that it is acceptable to take breaks from the medication.  Patient educated on the effects the medication can have on appetite.  Follow up in 2-4 weeks via MyChart with progress or concerns.    Meds ordered this encounter   lisdexamfetamine (VYVANSE) 10 MG capsule    Sig: Take 1 capsule (10 mg total) by mouth daily.    Dispense:  30 capsule    Refill:  0    Please note change in medication. Thanks.    Supervising Provider:   ALPHONSA GLENDIA LABOR [phone]   Discussed use of Adderall. After the visit, patient sent a message requesting a switch to Vyvanse since her sister uses this and has done well.    2. Fatigue, unspecified type - CBC with Differential - TSH - Comprehensive Metabolic Panel (CMET)  3. Screening for lipid disorders - Lipid Profile  4. GAD (generalized anxiety disorder) Patient educated on the possibility of stability of symptoms through medications in the future if needed. Patient made aware of options with therapy.  Options deferred for now per patient.   5. Thyroid  nodule - TSH   Return in about 3 months (around 09/11/2024).   I have seen and examined this patient alongside the NP student. I have reviewed and verified the student note and agree with the assessment and plan.  Elveria Quarry, FNP

## 2024-06-12 ENCOUNTER — Encounter: Payer: Self-pay | Admitting: Nurse Practitioner

## 2024-06-12 LAB — CBC WITH DIFFERENTIAL/PLATELET
Basophils Absolute: 0.1 x10E3/uL (ref 0.0–0.2)
Basos: 1 %
EOS (ABSOLUTE): 0.2 x10E3/uL (ref 0.0–0.4)
Eos: 3 %
Hematocrit: 42.7 % (ref 34.0–46.6)
Hemoglobin: 14.3 g/dL (ref 11.1–15.9)
Immature Grans (Abs): 0 x10E3/uL (ref 0.0–0.1)
Immature Granulocytes: 0 %
Lymphocytes Absolute: 2.2 x10E3/uL (ref 0.7–3.1)
Lymphs: 37 %
MCH: 32.6 pg (ref 26.6–33.0)
MCHC: 33.5 g/dL (ref 31.5–35.7)
MCV: 97 fL (ref 79–97)
Monocytes Absolute: 0.4 x10E3/uL (ref 0.1–0.9)
Monocytes: 6 %
Neutrophils Absolute: 3.2 x10E3/uL (ref 1.4–7.0)
Neutrophils: 52 %
Platelets: 324 x10E3/uL (ref 150–450)
RBC: 4.39 x10E6/uL (ref 3.77–5.28)
RDW: 12 % (ref 11.7–15.4)
WBC: 6.1 x10E3/uL (ref 3.4–10.8)

## 2024-06-12 LAB — COMPREHENSIVE METABOLIC PANEL WITH GFR
ALT: 22 IU/L (ref 0–32)
AST: 17 IU/L (ref 0–40)
Albumin: 4.5 g/dL (ref 3.9–4.9)
Alkaline Phosphatase: 53 IU/L (ref 41–116)
BUN/Creatinine Ratio: 14 (ref 9–23)
BUN: 10 mg/dL (ref 6–24)
Bilirubin Total: 0.5 mg/dL (ref 0.0–1.2)
CO2: 24 mmol/L (ref 20–29)
Calcium: 9.2 mg/dL (ref 8.7–10.2)
Chloride: 102 mmol/L (ref 96–106)
Creatinine, Ser: 0.69 mg/dL (ref 0.57–1.00)
Globulin, Total: 2.6 g/dL (ref 1.5–4.5)
Glucose: 86 mg/dL (ref 70–99)
Potassium: 3.8 mmol/L (ref 3.5–5.2)
Sodium: 141 mmol/L (ref 134–144)
Total Protein: 7.1 g/dL (ref 6.0–8.5)
eGFR: 111 mL/min/1.73 (ref >59)

## 2024-06-12 LAB — LIPID PANEL
Chol/HDL Ratio: 4.4 ratio (ref 0.0–4.4)
Cholesterol, Total: 209 mg/dL — ABNORMAL HIGH (ref 100–199)
HDL: 48 mg/dL (ref >39)
LDL Chol Calc (NIH): 143 mg/dL — ABNORMAL HIGH (ref 0–99)
Triglycerides: 102 mg/dL (ref 0–149)
VLDL Cholesterol Cal: 18 mg/dL (ref 5–40)

## 2024-06-12 LAB — TSH: TSH: 1.21 u[IU]/mL (ref 0.450–4.500)

## 2024-06-15 ENCOUNTER — Ambulatory Visit: Payer: Self-pay | Admitting: Nurse Practitioner

## 2024-07-05 ENCOUNTER — Ambulatory Visit: Admitting: Family Medicine

## 2024-07-05 VITALS — BP 130/84 | Ht 68.25 in | Wt 239.0 lb

## 2024-07-05 DIAGNOSIS — J019 Acute sinusitis, unspecified: Secondary | ICD-10-CM

## 2024-07-05 DIAGNOSIS — F988 Other specified behavioral and emotional disorders with onset usually occurring in childhood and adolescence: Secondary | ICD-10-CM | POA: Diagnosis not present

## 2024-07-05 MED ORDER — AZITHROMYCIN 250 MG PO TABS: ORAL_TABLET | ORAL | 0 refills | Status: AC

## 2024-07-05 MED ORDER — LISDEXAMFETAMINE DIMESYLATE 20 MG PO CAPS: 20.0000 mg | ORAL_CAPSULE | Freq: Every day | ORAL | 0 refills | Status: DC

## 2024-07-05 NOTE — Progress Notes (Signed)
   Subjective:    Patient ID: Dana Downs, female    DOB: August 17, 1980, 43 y.o.   MRN: [id number]  HPI Patient with head congestion drainage coughing sinus pressure symptoms over the past 2 weeks was hoarse last week but getting a voice back relates a lot of right ear pain right throat pain hurts with swallowing denies wheezing or shortness of breath  Has underlying issues with anxiety that she states she is managing currently she is working with Elveria Quarry, FNP She states recently she took Vyvanse  10 mg 1 daily states it helped some but she really did not see that it helped much we did discuss how these type of medicines can help with focus but have limited response for adults   Review of Systems     Objective:   Physical Exam General-in no acute distress Eyes-no discharge Lungs-respiratory rate normal, CTA CV-no murmurs,RRR Extremities skin warm dry no edema Neuro grossly normal Behavior normal, alert Mild sinus tenderness bilateral       Assessment & Plan:  Acute rhinosinusitis-amoxicillin 7 days as directed.  Follow-up if progressive troubles or worse  ADD-increase the dose of Vyvanse  20 mg Patient to give us  feedback in less than 3 weeks time how that is doing for her May need to adjust dose depending on her response  Follow-up with Elveria later in the spring sooner if any problems-3 to 4 months

## 2024-07-09 ENCOUNTER — Other Ambulatory Visit: Payer: Self-pay | Admitting: Family Medicine

## 2024-07-09 ENCOUNTER — Encounter: Payer: Self-pay | Admitting: Family Medicine

## 2024-07-09 DIAGNOSIS — F988 Other specified behavioral and emotional disorders with onset usually occurring in childhood and adolescence: Secondary | ICD-10-CM

## 2024-07-09 MED ORDER — LUBIPROSTONE 8 MCG PO CAPS: ORAL_CAPSULE | ORAL | 2 refills | Status: DC

## 2024-07-09 MED ORDER — LISDEXAMFETAMINE DIMESYLATE 20 MG PO CAPS: 20.0000 mg | ORAL_CAPSULE | Freq: Every day | ORAL | 0 refills | Status: DC

## 2024-08-03 ENCOUNTER — Other Ambulatory Visit: Payer: Self-pay | Admitting: Nurse Practitioner

## 2024-08-06 ENCOUNTER — Other Ambulatory Visit: Payer: Self-pay | Admitting: Nurse Practitioner

## 2024-08-13 ENCOUNTER — Other Ambulatory Visit: Payer: Self-pay | Admitting: Family Medicine

## 2024-08-13 ENCOUNTER — Encounter: Payer: Self-pay | Admitting: Family Medicine

## 2024-08-13 DIAGNOSIS — F988 Other specified behavioral and emotional disorders with onset usually occurring in childhood and adolescence: Secondary | ICD-10-CM

## 2024-08-21 ENCOUNTER — Encounter: Payer: Self-pay | Admitting: Family Medicine

## 2024-08-24 ENCOUNTER — Other Ambulatory Visit: Payer: Self-pay | Admitting: Obstetrics and Gynecology

## 2024-08-24 DIAGNOSIS — R928 Other abnormal and inconclusive findings on diagnostic imaging of breast: Secondary | ICD-10-CM

## 2024-08-25 ENCOUNTER — Inpatient Hospital Stay
Admission: RE | Admit: 2024-08-25 | Discharge: 2024-08-25 | Attending: Obstetrics and Gynecology | Admitting: Obstetrics and Gynecology

## 2024-08-25 ENCOUNTER — Other Ambulatory Visit

## 2024-08-25 DIAGNOSIS — R928 Other abnormal and inconclusive findings on diagnostic imaging of breast: Secondary | ICD-10-CM

## 2024-11-08 ENCOUNTER — Ambulatory Visit: Admitting: Family Medicine
